# Patient Record
Sex: Female | Born: 1975 | Race: White | Hispanic: No | Marital: Married | Smoking: Never smoker
Health system: Southern US, Community
[De-identification: ages and names within clinical notes are randomized; demographics above are authoritative.]

## PROBLEM LIST (undated history)

## (undated) DIAGNOSIS — R002 Palpitations: Secondary | ICD-10-CM

## (undated) DIAGNOSIS — S83512A Sprain of anterior cruciate ligament of left knee, initial encounter: Secondary | ICD-10-CM

## (undated) HISTORY — PX: BUNIONECTOMY: SHX129

## (undated) HISTORY — PX: ANTERIOR CRUCIATE LIGAMENT REPAIR: SHX115

---

## 2006-08-02 ENCOUNTER — Ambulatory Visit: Payer: Self-pay | Admitting: Podiatry

## 2007-05-01 ENCOUNTER — Ambulatory Visit: Payer: Self-pay | Admitting: Obstetrics and Gynecology

## 2007-05-02 ENCOUNTER — Inpatient Hospital Stay: Payer: Self-pay | Admitting: Obstetrics and Gynecology

## 2011-04-29 ENCOUNTER — Ambulatory Visit: Payer: Self-pay

## 2012-06-04 ENCOUNTER — Ambulatory Visit: Payer: Self-pay | Admitting: Obstetrics and Gynecology

## 2013-06-10 ENCOUNTER — Ambulatory Visit: Payer: Self-pay | Admitting: Obstetrics and Gynecology

## 2014-05-22 ENCOUNTER — Ambulatory Visit: Payer: Self-pay | Admitting: Family Medicine

## 2014-05-22 LAB — URINALYSIS, COMPLETE
BILIRUBIN, UR: NEGATIVE
GLUCOSE, UR: NEGATIVE
Ketone: NEGATIVE
Nitrite: NEGATIVE
PH: 7.5 (ref 5.0–8.0)
Protein: NEGATIVE
Specific Gravity: 1.02 (ref 1.000–1.030)

## 2014-05-23 ENCOUNTER — Emergency Department: Payer: Self-pay | Admitting: Emergency Medicine

## 2014-05-23 LAB — URINALYSIS, COMPLETE
Specific Gravity: 1.01 (ref 1.003–1.030)
Transitional Epi: 1
WBC UR: 1135 /HPF (ref 0–5)

## 2014-05-24 LAB — URINE CULTURE

## 2014-05-25 LAB — URINE CULTURE

## 2014-09-03 ENCOUNTER — Ambulatory Visit: Payer: Self-pay | Admitting: Obstetrics and Gynecology

## 2014-09-08 ENCOUNTER — Ambulatory Visit: Payer: Self-pay | Admitting: Obstetrics and Gynecology

## 2014-11-09 ENCOUNTER — Ambulatory Visit: Payer: Self-pay | Admitting: Internal Medicine

## 2015-08-08 ENCOUNTER — Other Ambulatory Visit: Payer: Self-pay | Admitting: Obstetrics and Gynecology

## 2015-08-08 DIAGNOSIS — Z1231 Encounter for screening mammogram for malignant neoplasm of breast: Secondary | ICD-10-CM

## 2015-09-06 ENCOUNTER — Ambulatory Visit
Admission: RE | Admit: 2015-09-06 | Discharge: 2015-09-06 | Disposition: A | Discharge status: Home / Self Care | Payer: 59 | Source: Ambulatory Visit | Attending: Obstetrics and Gynecology | Admitting: Obstetrics and Gynecology

## 2015-09-06 DIAGNOSIS — Z1231 Encounter for screening mammogram for malignant neoplasm of breast: Secondary | ICD-10-CM | POA: Insufficient documentation

## 2016-06-06 DIAGNOSIS — M25562 Pain in left knee: Secondary | ICD-10-CM | POA: Insufficient documentation

## 2016-06-06 DIAGNOSIS — S83512A Sprain of anterior cruciate ligament of left knee, initial encounter: Secondary | ICD-10-CM | POA: Insufficient documentation

## 2016-06-07 ENCOUNTER — Other Ambulatory Visit: Payer: Self-pay | Admitting: Unknown Physician Specialty

## 2016-06-07 DIAGNOSIS — S83512A Sprain of anterior cruciate ligament of left knee, initial encounter: Secondary | ICD-10-CM

## 2016-06-19 ENCOUNTER — Ambulatory Visit
Admission: RE | Admit: 2016-06-19 | Discharge: 2016-06-19 | Disposition: A | Discharge status: Home / Self Care | Payer: 59 | Source: Ambulatory Visit | Attending: Unknown Physician Specialty | Admitting: Unknown Physician Specialty

## 2016-06-19 DIAGNOSIS — X58XXXA Exposure to other specified factors, initial encounter: Secondary | ICD-10-CM | POA: Diagnosis not present

## 2016-06-19 DIAGNOSIS — S83512A Sprain of anterior cruciate ligament of left knee, initial encounter: Secondary | ICD-10-CM

## 2016-06-19 DIAGNOSIS — M25462 Effusion, left knee: Secondary | ICD-10-CM | POA: Diagnosis not present

## 2016-06-21 DIAGNOSIS — R002 Palpitations: Secondary | ICD-10-CM | POA: Insufficient documentation

## 2016-09-05 ENCOUNTER — Other Ambulatory Visit: Payer: Self-pay | Admitting: Family Medicine

## 2016-09-05 ENCOUNTER — Other Ambulatory Visit: Payer: Self-pay | Admitting: Obstetrics and Gynecology

## 2016-09-05 DIAGNOSIS — Z1231 Encounter for screening mammogram for malignant neoplasm of breast: Secondary | ICD-10-CM

## 2016-09-16 ENCOUNTER — Ambulatory Visit
Admission: EM | Admit: 2016-09-16 | Discharge: 2016-09-16 | Disposition: A | Discharge status: Home / Self Care | Payer: 59 | Attending: Family Medicine | Admitting: Family Medicine

## 2016-09-16 ENCOUNTER — Encounter: Payer: Self-pay | Admitting: Gynecology

## 2016-09-16 DIAGNOSIS — J029 Acute pharyngitis, unspecified: Secondary | ICD-10-CM | POA: Diagnosis not present

## 2016-09-16 DIAGNOSIS — N3001 Acute cystitis with hematuria: Secondary | ICD-10-CM | POA: Diagnosis not present

## 2016-09-16 DIAGNOSIS — J069 Acute upper respiratory infection, unspecified: Secondary | ICD-10-CM

## 2016-09-16 DIAGNOSIS — N390 Urinary tract infection, site not specified: Secondary | ICD-10-CM | POA: Diagnosis not present

## 2016-09-16 HISTORY — DX: Sprain of anterior cruciate ligament of left knee, initial encounter: S83.512A

## 2016-09-16 HISTORY — DX: Palpitations: R00.2

## 2016-09-16 LAB — URINALYSIS, COMPLETE (UACMP) WITH MICROSCOPIC
BILIRUBIN URINE: NEGATIVE
Glucose, UA: NEGATIVE mg/dL
Ketones, ur: NEGATIVE mg/dL
Nitrite: NEGATIVE
PROTEIN: 30 mg/dL — AB
SPECIFIC GRAVITY, URINE: 1.02 (ref 1.005–1.030)
pH: 7 (ref 5.0–8.0)

## 2016-09-16 LAB — RAPID STREP SCREEN (MED CTR MEBANE ONLY): STREPTOCOCCUS, GROUP A SCREEN (DIRECT): NEGATIVE

## 2016-09-16 MED ORDER — CEPHALEXIN 500 MG PO CAPS: 500.0000 mg | ORAL_CAPSULE | Freq: Two times a day (BID) | ORAL | 0 refills | Status: DC

## 2016-09-16 MED ORDER — PHENAZOPYRIDINE HCL 200 MG PO TABS: 200.0000 mg | ORAL_TABLET | Freq: Three times a day (TID) | ORAL | 0 refills | Status: DC | PRN

## 2016-09-16 NOTE — Discharge Instructions (Signed)
[place] suggest follow-up with PCP in 2-3 weeks for repeat urinalysis to make sure that the blood in her urine has cleared

## 2016-09-16 NOTE — ED Triage Notes (Signed)
Per patient sore throat x 3 days. Patient stated frequency urination.

## 2016-09-16 NOTE — ED Provider Notes (Signed)
[facility]    CSN: [id number] Arrival date & time: 09/16/16  0820     History   Chief Complaint Chief Complaint  Patient presents with  . Sore Throat  . Urinary Tract Infection    HPI Brittany Rivers is a 40 y.o. female.   Patient's here because of nasal congestion sore throat and cough for about the last 4 days started on Wednesday.. She reports yesterday she started feeling symptoms and she would like to have a UTI. She states she's had only one tablet for nausea about 2 years ago she was seen here placed on antibiotics didn't clear things up and she wanted in the emergency room after 3 days later having to have antibiotic change. She reports as far as the sore throat not been running strep recently. For her symptoms of a UTI she denies actually burning or discomfort is just a matter feeling much her bladder is not emptying completely. She states last time she had a UTI was difficult starting that cause her to be seen and treated. She denies any dyspareunia any type of vaginal discharge. She states that birth control as her husband vasectomy she denies any allergies she does not smoke. No pertinent family medical history relevant to today's visit.   The history is provided by the patient. No language interpreter was used.  Sore Throat  This is a new problem. The current episode started more than 2 days ago. The problem occurs constantly. The problem has not changed since onset.Pertinent negatives include no chest pain, no abdominal pain, no headaches and no shortness of breath. Nothing aggravates the symptoms. Nothing relieves the symptoms. She has tried nothing for the symptoms. The treatment provided no relief.  Urinary Tract Infection  Pain quality:  Unable to specify Pain severity:  No pain Onset quality:  Sudden Progression:  Worsening Chronicity:  New Relieved by:  Nothing Worsened by:  Nothing Ineffective treatments:  None tried Associated symptoms: no  abdominal pain, no fever, no flank pain, no genital lesions, no nausea and no vaginal discharge   Risk factors: sexually active   Risk factors: no hx of urolithiasis, no kidney transplant, not pregnant, no recurrent urinary tract infections and no sexually transmitted infections     Past Medical History:  Diagnosis Date  . Palpitations   . Rupture of anterior cruciate ligament of left knee     There are no active problems to display for this patient.   Past Surgical History:  Procedure Laterality Date  . ANTERIOR CRUCIATE LIGAMENT REPAIR    . BUNIONECTOMY    . CESAREAN SECTION      OB History    No data available       Home Medications    Prior to Admission medications   Medication Sig Start Date End Date Taking? Authorizing Provider  cephALEXin (KEFLEX) 500 MG capsule Take 1 capsule (500 mg total) by mouth 2 (two) times daily. 09/16/16   Frederich Cha, MD  phenazopyridine (PYRIDIUM) 200 MG tablet Take 1 tablet (200 mg total) by mouth 3 (three) times daily as needed for pain. 09/16/16   Frederich Cha, MD    Family History Family History  Problem Relation Age of Onset  . Breast cancer Neg Hx     Social History Social History  Substance Use Topics  . Smoking status: Never Smoker  . Smokeless tobacco: Never Used  . Alcohol use No     Allergies   Penicillins   Review of Systems  Review of Systems  Constitutional: Negative for fever.  Respiratory: Negative for shortness of breath.   Cardiovascular: Negative for chest pain.  Gastrointestinal: Negative for abdominal pain and nausea.  Genitourinary: Negative for flank pain and vaginal discharge.  Neurological: Negative for headaches.  All other systems reviewed and are negative.    Physical Exam Triage Vital Signs ED Triage Vitals  Enc Vitals Group     BP 09/16/16 0841 113/67     Pulse Rate 09/16/16 0841 80     Resp 09/16/16 0841 16     Temp 09/16/16 0841 98.7 F (37.1 C)     Temp Source 09/16/16 0841  Oral     SpO2 09/16/16 0841 100 %     Weight 09/16/16 0843 164 lb (74.4 kg)     Height 09/16/16 0843 5' 7"  (1.702 m)     Head Circumference --      Peak Flow --      Pain Score 09/16/16 0844 3     Pain Loc --      Pain Edu? --      Excl. in [place]? --    No data found.   Updated Vital Signs BP 113/67 (BP Location: Left Arm)   Pulse 80   Temp 98.7 F (37.1 C) (Oral)   Resp 16   Ht 5' 7"  (1.702 m)   Wt 164 lb (74.4 kg)   LMP 09/07/2016   SpO2 100%   BMI 25.69 kg/m   Visual Acuity Right Eye Distance:   Left Eye Distance:   Bilateral Distance:    Right Eye Near:   Left Eye Near:    Bilateral Near:     Physical Exam  Constitutional: She is oriented to person, place, and time.  HENT:  Head: Normocephalic and atraumatic.  Right Ear: Hearing, tympanic membrane, external ear and ear canal normal.  Left Ear: Hearing, tympanic membrane and ear canal normal.  Nose: Mucosal edema present. No rhinorrhea.  Mouth/Throat: Uvula is midline and mucous membranes are normal. Posterior oropharyngeal erythema present.  Eyes: EOM are normal. Pupils are equal, round, and reactive to light.  Neck: Normal range of motion. Neck supple.  Cardiovascular: Normal rate and regular rhythm.   Pulmonary/Chest: Effort normal and breath sounds normal.  Abdominal: Soft. She exhibits no distension. There is no tenderness.  Neurological: She is alert and oriented to person, place, and time.  Skin: Skin is warm and dry.  Psychiatric: She has a normal mood and affect.  Vitals reviewed.    UC Treatments / Results  Labs (all labs ordered are listed, but only abnormal results are displayed) Labs Reviewed  URINALYSIS, COMPLETE (UACMP) WITH MICROSCOPIC - Abnormal; Notable for the following:       Result Value   APPearance HAZY (*)    Hgb urine dipstick MODERATE (*)    Protein, ur 30 (*)    Leukocytes, UA LARGE (*)    Squamous Epithelial / LPF 0-5 (*)    Non Squamous Epithelial PRESENT (*)     Bacteria, UA FEW (*)    All other components within normal limits  RAPID STREP SCREEN (NOT AT [facility])  CULTURE, GROUP A STREP [facility])  URINE CULTURE    EKG  EKG Interpretation None       Radiology No results found.  Procedures Procedures (including critical care time)  Medications Ordered in UC Medications - No data to display   Initial Impression / Assessment and Plan / UC Course  I have reviewed  the triage vital signs and the nursing notes.  Pertinent labs & imaging results that were available during my care of the patient were reviewed by me and considered in my medical decision making (see chart for details).  Clinical Course     Patient will be treated for UTI with Keflex 500 mg 1 tablet twice a day should give coverage forced atypical strep may need to have repeat strep test of strep culture is positive. Because of mild blood specimen urine recommend follow-up PCP in 2-3 weeks for recheck or hematuria. Return follow-up PCP in a week if not doing better. Also explained to her that concerned with that amount of bacteria present with this could've been a vaginitis but since she denies any vaginal symptoms will treat for UTI and urine only had 0-5 epithelial cells present culture the urine was also obtained  Final Clinical Impressions(s) / UC Diagnoses   Final diagnoses:  Lower urinary tract infectious disease  Acute cystitis with hematuria  Pharyngitis, unspecified etiology  Upper respiratory tract infection, unspecified type    New Prescriptions Discharge Medication List as of 09/16/2016 10:06 AM    START taking these medications   Details  cephALEXin (KEFLEX) 500 MG capsule Take 1 capsule (500 mg total) by mouth 2 (two) times daily., Starting Sun 09/16/2016, Normal    phenazopyridine (PYRIDIUM) 200 MG tablet Take 1 tablet (200 mg total) by mouth 3 (three) times daily as needed for pain., Starting Sun 09/16/2016, Normal        Note: This dictation was prepared  with Dragon dictation along with smaller phrase technology. Any transcriptional errors that result from this process are unintentional.   Frederich Cha, MD 09/16/16 1118

## 2016-09-18 LAB — URINE CULTURE: SPECIAL REQUESTS: NORMAL

## 2016-09-19 LAB — CULTURE, GROUP A STREP (THRC)

## 2016-10-04 ENCOUNTER — Ambulatory Visit: Payer: 59

## 2016-10-31 ENCOUNTER — Ambulatory Visit
Admission: RE | Admit: 2016-10-31 | Discharge: 2016-10-31 | Disposition: A | Discharge status: Home / Self Care | Payer: 59 | Source: Ambulatory Visit | Attending: Family Medicine | Admitting: Family Medicine

## 2016-10-31 DIAGNOSIS — Z1231 Encounter for screening mammogram for malignant neoplasm of breast: Secondary | ICD-10-CM | POA: Insufficient documentation

## 2017-02-19 ENCOUNTER — Other Ambulatory Visit: Payer: Self-pay | Admitting: Obstetrics and Gynecology

## 2017-02-19 DIAGNOSIS — Z1231 Encounter for screening mammogram for malignant neoplasm of breast: Secondary | ICD-10-CM

## 2017-04-18 ENCOUNTER — Ambulatory Visit
Admission: EM | Admit: 2017-04-18 | Discharge: 2017-04-18 | Disposition: A | Discharge status: Home / Self Care | Payer: 59 | Attending: Emergency Medicine | Admitting: Emergency Medicine

## 2017-04-18 ENCOUNTER — Encounter: Payer: Self-pay | Admitting: Emergency Medicine

## 2017-04-18 DIAGNOSIS — R319 Hematuria, unspecified: Secondary | ICD-10-CM | POA: Diagnosis not present

## 2017-04-18 DIAGNOSIS — R350 Frequency of micturition: Secondary | ICD-10-CM

## 2017-04-18 DIAGNOSIS — N390 Urinary tract infection, site not specified: Secondary | ICD-10-CM | POA: Diagnosis not present

## 2017-04-18 LAB — URINALYSIS, COMPLETE (UACMP) WITH MICROSCOPIC
BILIRUBIN URINE: NEGATIVE
GLUCOSE, UA: NEGATIVE mg/dL
Hgb urine dipstick: NEGATIVE
Ketones, ur: NEGATIVE mg/dL
NITRITE: NEGATIVE
PH: 7 (ref 5.0–8.0)
Protein, ur: NEGATIVE mg/dL
RBC / HPF: NONE SEEN RBC/hpf (ref 0–5)
SPECIFIC GRAVITY, URINE: 1.01 (ref 1.005–1.030)

## 2017-04-18 MED ORDER — NITROFURANTOIN MONOHYD MACRO 100 MG PO CAPS: 100.0000 mg | ORAL_CAPSULE | Freq: Two times a day (BID) | ORAL | 0 refills | Status: DC

## 2017-04-18 MED ORDER — PHENAZOPYRIDINE HCL 200 MG PO TABS: 200.0000 mg | ORAL_TABLET | Freq: Three times a day (TID) | ORAL | 0 refills | Status: DC | PRN

## 2017-04-18 NOTE — ED Provider Notes (Signed)
HPI  SUBJECTIVE:  Brittany Rivers is a 41 y.o. female who presents with urinary urgency, frequency starting today. She tried pushing fluids without improvement in her symptoms. There are no aggravating factors. She denies dysuria, cloudy or odorous urine, hematuria. No nausea, vomiting, fevers, abdominal, back or pelvic pain. No vaginal bleeding, itch, odor, discharge, genital rash. She is in a long-term monogamous relationship with her husband who is asymptomatic. STDs are not a concern today. No recent antibiotics. No perfumed body washes/soaps. She has a past medical history of UTIs which were treated with Keflex. No history of pyelonephritis, nephrolithiasis, diabetes, hypertension. No history of gonorrhea, chlamydia, HIV, HSV, syphilis, Trichomonas, BV, yeast. LMP: 2 weeks ago. Denies possibility pregnant and states that we do not need to check. [id number], Titus Mould, DO   Past Medical History:  Diagnosis Date  . Palpitations   . Rupture of anterior cruciate ligament of left knee     Past Surgical History:  Procedure Laterality Date  . ANTERIOR CRUCIATE LIGAMENT REPAIR    . BUNIONECTOMY    . CESAREAN SECTION      Family History  Problem Relation Age of Onset  . Breast cancer Neg Hx     Social History  Substance Use Topics  . Smoking status: Never Smoker  . Smokeless tobacco: Never Used  . Alcohol use No    No current facility-administered medications for this encounter.   Current Outpatient Prescriptions:  .  nitrofurantoin, macrocrystal-monohydrate, (MACROBID) 100 MG capsule, Take 1 capsule (100 mg total) by mouth 2 (two) times daily. X 5 days, Disp: 10 capsule, Rfl: 0 .  phenazopyridine (PYRIDIUM) 200 MG tablet, Take 1 tablet (200 mg total) by mouth 3 (three) times daily as needed for pain., Disp: 6 tablet, Rfl: 0  Allergies  Allergen Reactions  . Penicillins Rash     ROS  As noted in HPI.   Physical Exam  BP 124/85 (BP Location: Left Arm)   Pulse 63    Resp 16   Ht 5' 7"  (1.702 m)   Wt 165 lb (74.8 kg)   LMP 04/04/2017 (Approximate)   SpO2 100%   BMI 25.84 kg/m   Constitutional: Well developed, well nourished, no acute distress Eyes:  EOMI, conjunctiva normal bilaterally HENT: Normocephalic, atraumatic,mucus membranes moist Respiratory: Normal inspiratory effort Cardiovascular: Normal rate GI: nondistended. Soft. No suprapubic or flank tenderness Back: No CVA tenderness GU: Deferred skin: No rash, skin intact Musculoskeletal: no deformities Neurologic: Alert & oriented x 3, no focal neuro deficits Psychiatric: Speech and behavior appropriate   ED Course   Medications - No data to display  Orders Placed This Encounter  Procedures  . Urine culture    Standing Status:   Standing    Number of Occurrences:   1  . Urinalysis, Complete w Microscopic    Standing Status:   Standing    Number of Occurrences:   1    Results for orders placed or performed during the hospital encounter of 04/18/17 (from the past 24 hour(s))  Urinalysis, Complete w Microscopic     Status: Abnormal   Collection Time: 04/18/17  7:46 PM  Result Value Ref Range   Color, Urine YELLOW YELLOW   APPearance CLEAR CLEAR   Specific Gravity, Urine 1.010 1.005 - 1.030   pH 7.0 5.0 - 8.0   Glucose, UA NEGATIVE NEGATIVE mg/dL   Hgb urine dipstick NEGATIVE NEGATIVE   Bilirubin Urine NEGATIVE NEGATIVE   Ketones, ur NEGATIVE NEGATIVE mg/dL   Protein,  ur NEGATIVE NEGATIVE mg/dL   Nitrite NEGATIVE NEGATIVE   Leukocytes, UA SMALL (A) NEGATIVE   Squamous Epithelial / LPF 0-5 (A) NONE SEEN   WBC, UA 6-30 0 - 5 WBC/hpf   RBC / HPF NONE SEEN 0 - 5 RBC/hpf   Bacteria, UA FEW (A) NONE SEEN   No results found.  ED Clinical Impression  Urinary tract infection without hematuria, site unspecified   ED Assessment/Plan  Previous labs reviewed. Patient had an Escherichia coli UTI that was sensitive to [facility] in January 2018.  UA does not clearly  demonstrate a UTI as it is a slightly contaminated specimen but she does have a few bacteria and small leukocytes however given patient's clinical presentation we'll treat empirically with Macrobid and Pyridium. Discussed with her the other causes of her symptoms such as gynecologic infections, and discussed with her the option of doing a pelvic today collecting samples however she has decided to treat empirically for a UTI. she will return here or see her doctor if she is not getting better with the medication. Sending urine off for culture to confirm antibiotic choice.  Discussed labs, imaging, MDM, plan and followup with patient  Discussed sn/sx that should prompt return to the ED. Patient agrees with plan.   Meds ordered this encounter  Medications  . phenazopyridine (PYRIDIUM) 200 MG tablet    Sig: Take 1 tablet (200 mg total) by mouth 3 (three) times daily as needed for pain.    Dispense:  6 tablet    Refill:  0  . nitrofurantoin, macrocrystal-monohydrate, (MACROBID) 100 MG capsule    Sig: Take 1 capsule (100 mg total) by mouth 2 (two) times daily. X 5 days    Dispense:  10 capsule    Refill:  0    *This clinic note was created using [employer]. Therefore, there may be occasional mistakes despite careful proofreading.  ?   Melynda Ripple, MD 04/18/17 2032

## 2017-04-18 NOTE — ED Triage Notes (Signed)
Patient c/o burning when urinating and increase in urinary urgency that started today.

## 2017-04-18 NOTE — Discharge Instructions (Signed)
continue drinking plenty of extra fluids. Finish the medication unless your doctor tells you to stop.

## 2017-04-20 LAB — URINE CULTURE

## 2017-11-06 ENCOUNTER — Ambulatory Visit
Admission: RE | Admit: 2017-11-06 | Discharge: 2017-11-06 | Disposition: A | Discharge status: Home / Self Care | Payer: Managed Care, Other (non HMO) | Source: Ambulatory Visit | Attending: Obstetrics and Gynecology | Admitting: Obstetrics and Gynecology

## 2017-11-06 DIAGNOSIS — Z1231 Encounter for screening mammogram for malignant neoplasm of breast: Secondary | ICD-10-CM | POA: Insufficient documentation

## 2017-11-06 DIAGNOSIS — R928 Other abnormal and inconclusive findings on diagnostic imaging of breast: Secondary | ICD-10-CM | POA: Insufficient documentation

## 2017-11-11 ENCOUNTER — Other Ambulatory Visit: Payer: Self-pay | Admitting: Obstetrics and Gynecology

## 2017-11-11 DIAGNOSIS — R928 Other abnormal and inconclusive findings on diagnostic imaging of breast: Secondary | ICD-10-CM

## 2017-11-11 DIAGNOSIS — N6489 Other specified disorders of breast: Secondary | ICD-10-CM

## 2017-11-14 ENCOUNTER — Ambulatory Visit
Admission: RE | Admit: 2017-11-14 | Discharge: 2017-11-14 | Disposition: A | Discharge status: Home / Self Care | Payer: Managed Care, Other (non HMO) | Source: Ambulatory Visit | Attending: Obstetrics and Gynecology | Admitting: Obstetrics and Gynecology

## 2017-11-14 DIAGNOSIS — N6489 Other specified disorders of breast: Secondary | ICD-10-CM | POA: Insufficient documentation

## 2017-11-14 DIAGNOSIS — R928 Other abnormal and inconclusive findings on diagnostic imaging of breast: Secondary | ICD-10-CM | POA: Diagnosis present

## 2018-08-01 ENCOUNTER — Other Ambulatory Visit: Payer: Self-pay | Admitting: Physician Assistant

## 2018-08-01 DIAGNOSIS — M5412 Radiculopathy, cervical region: Secondary | ICD-10-CM

## 2018-08-21 ENCOUNTER — Ambulatory Visit
Admission: RE | Admit: 2018-08-21 | Discharge: 2018-08-21 | Disposition: A | Discharge status: Home / Self Care | Payer: Managed Care, Other (non HMO) | Source: Ambulatory Visit | Attending: Physician Assistant | Admitting: Physician Assistant

## 2018-08-21 DIAGNOSIS — M50223 Other cervical disc displacement at C6-C7 level: Secondary | ICD-10-CM | POA: Diagnosis not present

## 2018-08-21 DIAGNOSIS — M5412 Radiculopathy, cervical region: Secondary | ICD-10-CM | POA: Insufficient documentation

## 2018-10-24 ENCOUNTER — Other Ambulatory Visit: Payer: Self-pay | Admitting: Physician Assistant

## 2018-10-24 DIAGNOSIS — Z1231 Encounter for screening mammogram for malignant neoplasm of breast: Secondary | ICD-10-CM

## 2018-11-11 ENCOUNTER — Ambulatory Visit
Admission: RE | Admit: 2018-11-11 | Discharge: 2018-11-11 | Disposition: A | Discharge status: Home / Self Care | Payer: Managed Care, Other (non HMO) | Source: Ambulatory Visit | Attending: Physician Assistant | Admitting: Physician Assistant

## 2018-11-11 DIAGNOSIS — Z1231 Encounter for screening mammogram for malignant neoplasm of breast: Secondary | ICD-10-CM | POA: Diagnosis present

## 2019-10-07 ENCOUNTER — Other Ambulatory Visit: Payer: Self-pay | Admitting: Physician Assistant

## 2019-10-07 DIAGNOSIS — K219 Gastro-esophageal reflux disease without esophagitis: Secondary | ICD-10-CM

## 2019-10-07 DIAGNOSIS — R1906 Epigastric swelling, mass or lump: Secondary | ICD-10-CM

## 2019-10-07 DIAGNOSIS — K30 Functional dyspepsia: Secondary | ICD-10-CM

## 2019-10-13 ENCOUNTER — Other Ambulatory Visit: Payer: Self-pay

## 2019-10-13 ENCOUNTER — Ambulatory Visit
Admission: RE | Admit: 2019-10-13 | Discharge: 2019-10-13 | Disposition: A | Discharge status: Home / Self Care | Payer: Managed Care, Other (non HMO) | Source: Ambulatory Visit | Attending: Physician Assistant | Admitting: Physician Assistant

## 2019-10-13 DIAGNOSIS — K219 Gastro-esophageal reflux disease without esophagitis: Secondary | ICD-10-CM | POA: Insufficient documentation

## 2019-10-13 DIAGNOSIS — R1906 Epigastric swelling, mass or lump: Secondary | ICD-10-CM | POA: Diagnosis present

## 2019-10-13 DIAGNOSIS — K30 Functional dyspepsia: Secondary | ICD-10-CM | POA: Diagnosis present

## 2019-10-14 ENCOUNTER — Other Ambulatory Visit: Payer: Self-pay | Admitting: Physician Assistant

## 2019-10-14 DIAGNOSIS — Z1231 Encounter for screening mammogram for malignant neoplasm of breast: Secondary | ICD-10-CM

## 2019-11-16 ENCOUNTER — Ambulatory Visit
Admission: RE | Admit: 2019-11-16 | Discharge: 2019-11-16 | Disposition: A | Discharge status: Home / Self Care | Payer: Managed Care, Other (non HMO) | Source: Ambulatory Visit | Attending: Physician Assistant | Admitting: Physician Assistant

## 2019-11-16 ENCOUNTER — Other Ambulatory Visit: Payer: Self-pay

## 2019-11-16 DIAGNOSIS — Z1231 Encounter for screening mammogram for malignant neoplasm of breast: Secondary | ICD-10-CM

## 2020-04-27 ENCOUNTER — Ambulatory Visit (INDEPENDENT_AMBULATORY_CARE_PROVIDER_SITE_OTHER): Payer: Managed Care, Other (non HMO) | Admitting: Dermatology

## 2020-04-27 ENCOUNTER — Other Ambulatory Visit: Payer: Self-pay

## 2020-04-27 DIAGNOSIS — Z1283 Encounter for screening for malignant neoplasm of skin: Secondary | ICD-10-CM

## 2020-04-27 DIAGNOSIS — D229 Melanocytic nevi, unspecified: Secondary | ICD-10-CM

## 2020-04-27 DIAGNOSIS — L719 Rosacea, unspecified: Secondary | ICD-10-CM

## 2020-04-27 DIAGNOSIS — D485 Neoplasm of uncertain behavior of skin: Secondary | ICD-10-CM

## 2020-04-27 DIAGNOSIS — L815 Leukoderma, not elsewhere classified: Secondary | ICD-10-CM

## 2020-04-27 DIAGNOSIS — L578 Other skin changes due to chronic exposure to nonionizing radiation: Secondary | ICD-10-CM

## 2020-04-27 DIAGNOSIS — L814 Other melanin hyperpigmentation: Secondary | ICD-10-CM

## 2020-04-27 DIAGNOSIS — L821 Other seborrheic keratosis: Secondary | ICD-10-CM

## 2020-04-27 DIAGNOSIS — D1800 Hemangioma unspecified site: Secondary | ICD-10-CM

## 2020-04-27 NOTE — Patient Instructions (Addendum)
Melanoma ABCDEs  Melanoma is the most dangerous type of skin cancer, and is the leading cause of death from skin disease.  You are more likely to develop melanoma if you:  Have light-colored skin, light-colored eyes, or red or blond hair  Spend a lot of time in the sun  Tan regularly, either outdoors or in a tanning bed  Have had blistering sunburns, especially during childhood  Have a close family member who has had a melanoma  Have atypical moles or large birthmarks  Early detection of melanoma is key since treatment is typically straightforward and cure rates are extremely high if we catch it early.   The first sign of melanoma is often a change in a mole or a new dark spot.  The ABCDE system is a way of remembering the signs of melanoma.  A for asymmetry:  The two halves do not match. B for border:  The edges of the growth are irregular. C for color:  A mixture of colors are present instead of an even brown color. D for diameter:  Melanomas are usually (but not always) greater than 17m - the size of a pencil eraser. E for evolution:  The spot keeps changing in size, shape, and color.  Please check your skin once per month between visits. You can use a small mirror in front and a large mirror behind you to keep an eye on the back side or your body.   If you see any new or changing lesions before your next follow-up, please call to schedule a visit.  Please continue daily skin protection including broad spectrum sunscreen SPF 30+ to sun-exposed areas, reapplying every 2 hours as needed when you're outdoors.   Rosacea  What is rosacea? Rosacea (say: ro-zay-sha) is a common skin disease that usually begins as a trend of flushing or blushing easily.  As rosacea progresses, a persistent redness in the center of the face will develop and may gradually spread beyond the nose and cheeks to the forehead and chin.  In some cases, the ears, chest, and back could be affected.  Rosacea may  appear as tiny blood vessels or small red bumps that occur in crops.  Frequently they can contain pus, and are called pustules.  If the bumps do not contain pus, they are referred to as papules.  Rarely, in prolonged, untreated cases of rosacea, the oil glands of the nose and cheeks may become permanently enlarged.  This is called rhinophyma, and is seen more frequently in men.  Signs and Risks In its beginning stages, rosacea tends to come and go, which makes it difficult to recognize.  It can start as intermittent flushing of the face.  Eventually, blood vessels may become permanently visible.  Pustules and papules can appear, but can be mistaken for adult acne.  People of all races, ages, genders and ethnic groups are at risk of developing rosacea.  However, it is more common in women (especially around menopause) and adults with fair skin between the ages of 352and 539  Treatment Dermatologists typically recommend a combination of treatments to effectively manage rosacea.  Treatment can improve symptoms and may stop the progression of the rosacea.  Treatment may involve both topical and oral medications.  The tetracycline antibiotics are often used for their anti-inflammatory effect; however, because of the possibility of developing antibiotic resistance, they should not be used long term at full dose.  For dilated blood vessels the options include electrodessication (uses electric current  through a small needle), laser treatment, and cosmetics to hide the redness.   With all forms of treatment, improvement is a slow process, and patients may not see any results for the first 3-4 weeks.  It is very important to avoid the sun and other triggers.  Patients must wear sunscreen daily.  Skin Care Instructions: 1. Cleanse the skin with a mild soap such as CeraVe cleanser, Cetaphil cleanser, or Dove soap once or twice daily as needed. 2. Moisturize with Eucerin Redness Relief Daily Perfecting Lotion (has  a subtle green tint), CeraVe Moisturizing Cream, or Oil of Olay Daily Moisturizer with sunscreen every morning and/or night as recommended. 3. Makeup should be non-comedogenic (wont clog pores) and be labeled for sensitive skin. Good choices for cosmetics are: Neutrogena, Almay, and Physicians Formula.  Any product with a green tint tends to offset a red complexion. 4. If your eyes are dry and irritated, use artificial tears 2-3 times per day and cleanse the eyelids daily with baby shampoo.  Have your eyes examined at least every 2 years.  Be sure to tell your eye doctor that you have rosacea. 5. Alcoholic beverages tend to cause flushing of the skin, and may make rosacea worse. 6. Always wear sunscreen, protect your skin from extreme hot and cold temperatures, and avoid spicy foods, hot drinks, and mechanical irritation such as rubbing, scrubbing, or massaging the face.  Avoid harsh skin cleansers, cleansing masks, astringents, and exfoliation. If a particular product burns or makes your face feel tight, then it is likely to flare your rosacea. 7. If you are having difficulty finding a sunscreen that you can tolerate, you may try switching to a chemical-free sunscreen.  These are ones whose active ingredient is zinc oxide or titanium dioxide only.  They should also be fragrance free, non-comedogenic, and labeled for sensitive skin. 8. Rosacea triggers may vary from person to person.  There are a variety of foods that have been reported to trigger rosacea.  Some patients find that keeping a diary of what they were doing when they flared helps them avoid triggers.

## 2020-04-27 NOTE — Progress Notes (Signed)
   New Patient Visit  Subjective  Brittany Rivers is a 44 y.o. female who presents for the following: Annual Exam full body skin exam and skin cancer screening  Patient here today for TBSE. She had 2 spots on the skin removed about 15 years ago and was told results showed some abnormal cells. They had to go back and remove more. There is nothing new or changing that patient is aware of today.  The following portions of the chart were reviewed this encounter and updated as appropriate:  Tobacco  Allergies  Meds  Problems  Med Hx  Surg Hx  Fam Hx      Review of Systems:  No other skin or systemic complaints except as noted in HPI or Assessment and Plan.  Objective  Well appearing patient in no apparent distress; mood and affect are within normal limits.  A full examination was performed including scalp, head, eyes, ears, nose, lips, neck, chest, axillae, abdomen, back, buttocks, bilateral upper extremities, bilateral lower extremities, hands, feet, fingers, toes, fingernails, and toenails. All findings within normal limits unless otherwise noted below.  Objective  face: Erythema at mid face  Objective  Legs: White spots  Objective  Right buttock: Hypopigmented soft plaque   Assessment & Plan  Rosacea face  Patient advises no grittiness or irritation of her eyes Discussed option of topical medication (metronidazole cream, ivermectin cream, rhofade). Treatment deferred   Guttate hypomelanosis Legs  Sun-induced. Benign, observe. Recommend daily broad spectrum sunscreen SPF 30+ to sun-exposed areas, reapply every 2 hours as needed. Call for new or changing lesions.    Neoplasm of uncertain behavior of skin Right buttock  Favor Nevus Lipomatosis Superficialis  Benign-appearing.  Observation.  Call clinic for new or changing lesions.  Recommend daily use of broad spectrum spf 30+ sunscreen to sun-exposed areas.     Lentigines - Scattered tan macules - Discussed  due to sun exposure - Benign, observe - Call for any changes  Seborrheic Keratoses - Stuck-on, waxy, tan-brown papules and plaques  - Discussed benign etiology and prognosis. - Observe - Call for any changes  Melanocytic Nevi - Tan-brown and/or pink-flesh-colored symmetric macules and papules - Benign appearing on exam today - Observation - Call clinic for new or changing moles - Recommend daily use of broad spectrum spf 30+ sunscreen to sun-exposed areas.   Hemangiomas - Red papules - Discussed benign nature - Observe - Call for any changes  Actinic Damage - diffuse scaly erythematous macules with underlying dyspigmentation - Recommend daily broad spectrum sunscreen SPF 30+ to sun-exposed areas, reapply every 2 hours as needed.  - Call for new or changing lesions.  Skin cancer screening performed today.  Return in about 1 year (around 04/27/2021) for TBSE.  Graciella Belton, RMA, am acting as scribe for Forest Gleason, MD .  Documentation: I have reviewed the above documentation for accuracy and completeness, and I agree with the above.  Forest Gleason, MD

## 2020-05-08 ENCOUNTER — Encounter: Payer: Self-pay | Admitting: Dermatology

## 2020-08-22 DIAGNOSIS — R0602 Shortness of breath: Secondary | ICD-10-CM | POA: Insufficient documentation

## 2020-10-28 ENCOUNTER — Other Ambulatory Visit: Payer: Self-pay | Admitting: Physician Assistant

## 2020-10-28 DIAGNOSIS — Z1231 Encounter for screening mammogram for malignant neoplasm of breast: Secondary | ICD-10-CM

## 2020-11-14 ENCOUNTER — Encounter: Payer: Self-pay | Admitting: Emergency Medicine

## 2020-11-14 ENCOUNTER — Ambulatory Visit
Admission: EM | Admit: 2020-11-14 | Discharge: 2020-11-14 | Disposition: A | Discharge status: Home / Self Care | Payer: Managed Care, Other (non HMO) | Attending: Physician Assistant | Admitting: Physician Assistant

## 2020-11-14 ENCOUNTER — Other Ambulatory Visit: Payer: Self-pay

## 2020-11-14 DIAGNOSIS — H6001 Abscess of right external ear: Secondary | ICD-10-CM | POA: Diagnosis not present

## 2020-11-14 MED ORDER — DOXYCYCLINE HYCLATE 100 MG PO CAPS: 100.0000 mg | ORAL_CAPSULE | Freq: Two times a day (BID) | ORAL | 0 refills | Status: AC

## 2020-11-14 NOTE — Discharge Instructions (Signed)
You have an abscess or a skin infection.  Start the antibiotics as prescribed and take full course.  Apply moist heat to the area several times throughout the day and you can also take Tylenol and ibuprofen for pain relief as needed.  Keep an eye on this area and if any swelling, redness or pain worsen you may need to be seen again or call our office.

## 2020-11-14 NOTE — ED Triage Notes (Signed)
Pt c/o right ear pain, redness and swelling. Started about 3 days ago and has gotten worse.

## 2020-11-14 NOTE — ED Provider Notes (Signed)
[facility]    CSN: [id number] Arrival date & time: 11/14/20  6433      History   Chief Complaint Chief Complaint  Patient presents with  . Otalgia    right    HPI Brittany Rivers is a 45 y.o. female presenting for approximately 3-day history of swelling, redness and erythema of her right tragus.  Patient states that the swelling is little better today than yesterday.  She has been applying warm compresses and taking ibuprofen.  Patient denies any injury to the ear or abrasion/lacerations.  She denies drainage from the ear.  Patient does state that she "scratches inside the ear a lot."  She denies any fever, fatigue, headaches, dizziness, cough, congestion or sore throat.  Denies history of recurrent skin infections.  Patient has no other complaints or concerns today.  HPI  Past Medical History:  Diagnosis Date  . Palpitations   . Rupture of anterior cruciate ligament of left knee     There are no problems to display for this patient.   Past Surgical History:  Procedure Laterality Date  . ANTERIOR CRUCIATE LIGAMENT REPAIR    . BUNIONECTOMY    . CESAREAN SECTION      OB History   No obstetric history on file.      Home Medications    Prior to Admission medications   Medication Sig Start Date End Date Taking? Authorizing Provider  doxycycline (VIBRAMYCIN) 100 MG capsule Take 1 capsule (100 mg total) by mouth 2 (two) times daily for 7 days. 11/14/20 11/21/20 Yes Danton Clap, PA-C    Family History Family History  Problem Relation Age of Onset  . Breast cancer Neg Hx     Social History Social History   Tobacco Use  . Smoking status: Never Smoker  . Smokeless tobacco: Never Used  Vaping Use  . Vaping Use: Never used  Substance Use Topics  . Alcohol use: No     Allergies   Penicillins   Review of Systems Review of Systems  Constitutional: Negative for chills, diaphoresis, fatigue and fever.  HENT: Positive for ear pain. Negative for  congestion, rhinorrhea and sore throat.   Respiratory: Negative for cough.   Gastrointestinal: Negative for nausea and vomiting.  Skin: Negative for rash.  Neurological: Negative for weakness and headaches.  Hematological: Negative for adenopathy.     Physical Exam Triage Vital Signs ED Triage Vitals  Enc Vitals Group     BP 11/14/20 0845 126/73     Pulse Rate 11/14/20 0845 76     Resp 11/14/20 0845 18     Temp 11/14/20 0845 98.4 F (36.9 C)     Temp Source 11/14/20 0845 Oral     SpO2 11/14/20 0845 100 %     Weight 11/14/20 0844 164 lb 14.5 oz (74.8 kg)     Height 11/14/20 0844 5' 7"  (1.702 m)     Head Circumference --      Peak Flow --      Pain Score 11/14/20 0843 2     Pain Loc --      Pain Edu? --      Excl. in [place]? --    No data found.  Updated Vital Signs BP 126/73 (BP Location: Right Arm)   Pulse 76   Temp 98.4 F (36.9 C) (Oral)   Resp 18   Ht 5' 7"  (1.702 m)   Wt 164 lb 14.5 oz (74.8 kg)   LMP 10/17/2020  SpO2 100%   BMI 25.83 kg/m       Physical Exam Vitals and nursing note reviewed.  Constitutional:      General: She is not in acute distress.    Appearance: Normal appearance. She is not ill-appearing or toxic-appearing.  HENT:     Head: Normocephalic and atraumatic.     Right Ear: Tympanic membrane and ear canal normal.     Left Ear: Tympanic membrane, ear canal and external ear normal.     Ears:     Comments: Moderate induration, erythema, and tenderness of the right tragus.  No fluctuance.  No drainage.    Nose: Nose normal.     Mouth/Throat:     Mouth: Mucous membranes are moist.     Pharynx: Oropharynx is clear.  Eyes:     General: No scleral icterus.       Right eye: No discharge.        Left eye: No discharge.     Conjunctiva/sclera: Conjunctivae normal.  Cardiovascular:     Rate and Rhythm: Normal rate and regular rhythm.     Heart sounds: Normal heart sounds.  Pulmonary:     Effort: Pulmonary effort is normal. No respiratory  distress.     Breath sounds: Normal breath sounds.  Musculoskeletal:     Cervical back: Neck supple.  Skin:    General: Skin is dry.  Neurological:     General: No focal deficit present.     Mental Status: She is alert. Mental status is at baseline.     Motor: No weakness.     Gait: Gait normal.  Psychiatric:        Mood and Affect: Mood normal.        Behavior: Behavior normal.        Thought Content: Thought content normal.      UC Treatments / Results  Labs (all labs ordered are listed, but only abnormal results are displayed) Labs Reviewed - No data to display  EKG   Radiology No results found.  Procedures Procedures (including critical care time)  Medications Ordered in UC Medications - No data to display  Initial Impression / Assessment and Plan / UC Course  I have reviewed the triage vital signs and the nursing notes.  Pertinent labs & imaging results that were available during my care of the patient were reviewed by me and considered in my medical decision making (see chart for details).   Exam is consistent with abscess right tragus. Treating with doxycycline and supportive care at this time.  Is warm compresses.  Advised Tylenol or ibuprofen for pain relief as needed.  Explained that she should breathing better in the next couple of days.  ED precautions and return precautions reviewed with patient.   Final Clinical Impressions(s) / UC Diagnoses   Final diagnoses:  Abscess of tragus of right ear     Discharge Instructions     You have an abscess or a skin infection.  Start the antibiotics as prescribed and take full course.  Apply moist heat to the area several times throughout the day and you can also take Tylenol and ibuprofen for pain relief as needed.  Keep an eye on this area and if any swelling, redness or pain worsen you may need to be seen again or call our office.    ED Prescriptions    Medication Sig Dispense Auth. Provider   doxycycline  (VIBRAMYCIN) 100 MG capsule Take 1 capsule (100 mg total)  by mouth 2 (two) times daily for 7 days. 14 capsule Danton Clap, PA-C     PDMP not reviewed this encounter.   Danton Clap, PA-C 11/14/20 [phone]

## 2020-11-16 ENCOUNTER — Other Ambulatory Visit: Payer: Self-pay

## 2020-11-16 ENCOUNTER — Ambulatory Visit
Admission: RE | Admit: 2020-11-16 | Discharge: 2020-11-16 | Disposition: A | Discharge status: Home / Self Care | Payer: Managed Care, Other (non HMO) | Source: Ambulatory Visit | Attending: Physician Assistant | Admitting: Physician Assistant

## 2020-11-16 DIAGNOSIS — Z1231 Encounter for screening mammogram for malignant neoplasm of breast: Secondary | ICD-10-CM

## 2020-12-02 ENCOUNTER — Other Ambulatory Visit: Payer: Self-pay | Admitting: Sports Medicine

## 2020-12-02 ENCOUNTER — Other Ambulatory Visit (HOSPITAL_COMMUNITY): Payer: Self-pay | Admitting: Sports Medicine

## 2020-12-02 DIAGNOSIS — G8929 Other chronic pain: Secondary | ICD-10-CM

## 2020-12-02 DIAGNOSIS — M778 Other enthesopathies, not elsewhere classified: Secondary | ICD-10-CM

## 2020-12-02 DIAGNOSIS — M79671 Pain in right foot: Secondary | ICD-10-CM

## 2020-12-19 ENCOUNTER — Ambulatory Visit
Admission: RE | Admit: 2020-12-19 | Discharge: 2020-12-19 | Disposition: A | Discharge status: Home / Self Care | Payer: Managed Care, Other (non HMO) | Source: Ambulatory Visit | Attending: Sports Medicine | Admitting: Sports Medicine

## 2020-12-19 ENCOUNTER — Other Ambulatory Visit: Payer: Self-pay

## 2020-12-19 DIAGNOSIS — M778 Other enthesopathies, not elsewhere classified: Secondary | ICD-10-CM | POA: Diagnosis present

## 2020-12-19 DIAGNOSIS — G8929 Other chronic pain: Secondary | ICD-10-CM

## 2020-12-19 DIAGNOSIS — M79671 Pain in right foot: Secondary | ICD-10-CM | POA: Diagnosis present

## 2021-04-27 ENCOUNTER — Encounter: Payer: Self-pay | Admitting: Dermatology

## 2021-04-27 ENCOUNTER — Ambulatory Visit (INDEPENDENT_AMBULATORY_CARE_PROVIDER_SITE_OTHER): Payer: Managed Care, Other (non HMO) | Admitting: Dermatology

## 2021-04-27 ENCOUNTER — Other Ambulatory Visit: Payer: Self-pay

## 2021-04-27 DIAGNOSIS — L821 Other seborrheic keratosis: Secondary | ICD-10-CM | POA: Diagnosis not present

## 2021-04-27 DIAGNOSIS — L578 Other skin changes due to chronic exposure to nonionizing radiation: Secondary | ICD-10-CM

## 2021-04-27 DIAGNOSIS — Z1283 Encounter for screening for malignant neoplasm of skin: Secondary | ICD-10-CM | POA: Diagnosis not present

## 2021-04-27 DIAGNOSIS — D1800 Hemangioma unspecified site: Secondary | ICD-10-CM

## 2021-04-27 DIAGNOSIS — L814 Other melanin hyperpigmentation: Secondary | ICD-10-CM

## 2021-04-27 DIAGNOSIS — D229 Melanocytic nevi, unspecified: Secondary | ICD-10-CM

## 2021-04-27 NOTE — Progress Notes (Signed)
   Follow-Up Visit   Subjective  Brittany Rivers is a 45 y.o. female who presents for the following: Annual Exam (Patient here today for a full body exam. Patient states she has mole on right shoulder she would like checked. Patient denies other concerns at follow up. She has no history of skin cancer. ).  Patient here for full body skin exam and skin cancer screening.  The following portions of the chart were reviewed this encounter and updated as appropriate:  Tobacco  Allergies  Meds  Problems  Med Hx  Surg Hx  Fam Hx       Objective  Well appearing patient in no apparent distress; mood and affect are within normal limits.  A full examination was performed including scalp, head, eyes, ears, nose, lips, neck, chest, axillae, abdomen, back, buttocks, bilateral upper extremities, bilateral lower extremities, hands, feet, fingers, toes, fingernails, and toenails. All findings within normal limits unless otherwise noted below.   Assessment & Plan  Lentigines - Scattered tan macules - Due to sun exposure - Benign-appering, observe - Recommend daily broad spectrum sunscreen SPF 30+ to sun-exposed areas, reapply every 2 hours as needed. - Call for any changes  Seborrheic Keratoses - Stuck-on, waxy, tan-brown papules and/or plaques at face and at posterior right shoulder - Benign-appearing - Discussed benign etiology and prognosis. - Observe - Call for any changes  Melanocytic Nevi - Tan-brown and/or pink-flesh-colored symmetric macules and papules - Benign appearing on exam today - Observation - Call clinic for new or changing moles - Recommend daily use of broad spectrum spf 30+ sunscreen to sun-exposed areas.   Hemangiomas - Red papules - Discussed benign nature - Observe - Call for any changes  Actinic Damage - Chronic condition, secondary to cumulative UV/sun exposure - diffuse scaly erythematous macules with underlying dyspigmentation - Recommend daily broad  spectrum sunscreen SPF 30+ to sun-exposed areas, reapply every 2 hours as needed.  - Staying in the shade or wearing long sleeves, sun glasses (UVA+UVB protection) and wide brim hats (4-inch brim around the entire circumference of the hat) are also recommended for sun protection.  - Call for new or changing lesions.  Skin cancer screening performed today.  Return for 1 - 2 year tbse . I, Ruthell Rummage, CMA, am acting as scribe for Forest Gleason, MD.  Documentation: I have reviewed the above documentation for accuracy and completeness, and I agree with the above.  Forest Gleason, MD

## 2021-04-27 NOTE — Patient Instructions (Signed)
Melanoma ABCDEs  Melanoma is the most dangerous type of skin cancer, and is the leading cause of death from skin disease.  You are more likely to develop melanoma if you: Have light-colored skin, light-colored eyes, or red or blond hair Spend a lot of time in the sun Tan regularly, either outdoors or in a tanning bed Have had blistering sunburns, especially during childhood Have a close family member who has had a melanoma Have atypical moles or large birthmarks  Early detection of melanoma is key since treatment is typically straightforward and cure rates are extremely high if we catch it early.   The first sign of melanoma is often a change in a mole or a new dark spot.  The ABCDE system is a way of remembering the signs of melanoma.  A for asymmetry:  The two halves do not match. B for border:  The edges of the growth are irregular. C for color:  A mixture of colors are present instead of an even brown color. D for diameter:  Melanomas are usually (but not always) greater than 34m - the size of a pencil eraser. E for evolution:  The spot keeps changing in size, shape, and color.  Please check your skin once per month between visits. You can use a small mirror in front and a large mirror behind you to keep an eye on the back side or your body.   If you see any new or changing lesions before your next follow-up, please call to schedule a visit.  Please continue daily skin protection including broad spectrum sunscreen SPF 30+ to sun-exposed areas, reapplying every 2 hours as needed when you're outdoors.   Staying in the shade or wearing long sleeves, sun glasses (UVA+UVB protection) and wide brim hats (4-inch brim around the entire circumference of the hat) are also recommended for sun protection.    Recommend taking Heliocare sun protection supplement daily in sunny weather for additional sun protection. For maximum protection on the sunniest days, you can take up to 2 capsules of  regular Heliocare OR take 1 capsule of Heliocare Ultra. For prolonged exposure (such as a full day in the sun), you can repeat your dose of the supplement 4 hours after your first dose. Heliocare can be purchased at [facility]or at [email or web address]    If you have any questions or concerns for your doctor, please call our main line at [phone] press option 4 to reach your doctor's medical assistant. If no one answers, please leave a voicemail as directed and we will return your call as soon as possible. Messages left after 4 pm will be answered the following business day.   You may also send [place] message via [place] We typically respond to MyChart messages within 1-2 business days.  For prescription refills, please ask your pharmacy to contact our office. Our fax number is [phone]  If you have an urgent issue when the clinic is closed that cannot wait until the next business day, you can page your doctor at the number below.    Please note that while we do our best to be available for urgent issues outside of office hours, we are not available 24/7.   If you have an urgent issue and are unable to reach [place] you may choose to seek medical care at your doctor's office, retail clinic, urgent care center, or emergency room.  If you have a medical emergency, please immediately call 911 or go to  the emergency department.  Pager Numbers  - Dr. Nehemiah Massed: [phone]  - Dr. Laurence Ferrari: [phone]  - Dr. Nicole Kindred: [phone]  In the event of inclement weather, please call our main line at [phone] for an update on the status of any delays or closures.  Dermatology Medication Tips: Please keep the boxes that topical medications come in in order to help keep track of the instructions about where and how to use these. Pharmacies typically print the medication instructions only on the boxes and not directly on the medication tubes.   If your medication is too expensive,  please contact our office at [phone] option 4 or send [place] a message through [place].   We are unable to tell what your co-pay for medications will be in advance as this is different depending on your insurance coverage. However, we may be able to find a substitute medication at lower cost or fill out paperwork to get insurance to cover a needed medication.   If a prior authorization is required to get your medication covered by your insurance company, please allow [place] 1-2 business days to complete this process.  Drug prices often vary depending on where the prescription is filled and some pharmacies may offer cheaper prices.  The website www.goodrx.com contains coupons for medications through different pharmacies. The prices here do not account for what the cost may be with help from insurance (it may be cheaper with your insurance), but the website can give you the price if you did not use any insurance.  - You can print the associated coupon and take it with your prescription to the pharmacy.  - You may also stop by our office during regular business hours and pick up a GoodRx coupon card.  - If you need your prescription sent electronically to a different pharmacy, notify our office through [facility] or by phone at [phone] option 4.

## 2021-06-07 LAB — COLOGUARD: COLOGUARD: NEGATIVE

## 2021-07-28 IMAGING — US US ABDOMEN LIMITED
1 series · 14 of 25 positions shown · non-contrast
Comparison: None.

CLINICAL DATA: Indigestion

EXAM:
ULTRASOUND ABDOMEN LIMITED RIGHT UPPER QUADRANT

[Series 1: us abdomen limited · 0.20mm/px · 14 of 46 slices shown]
[im 1/46]
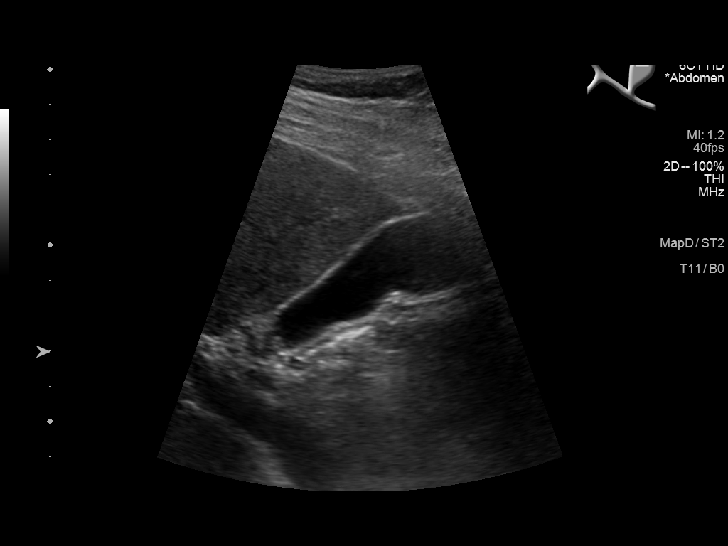
[im 4/46]
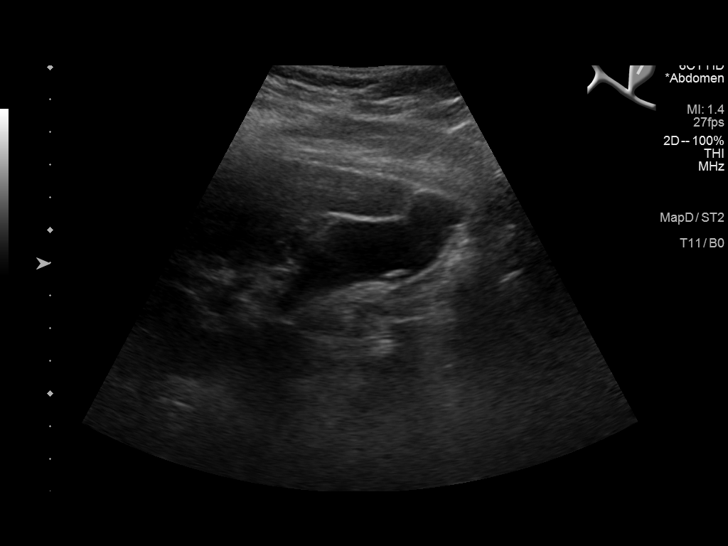
[im 8/46]
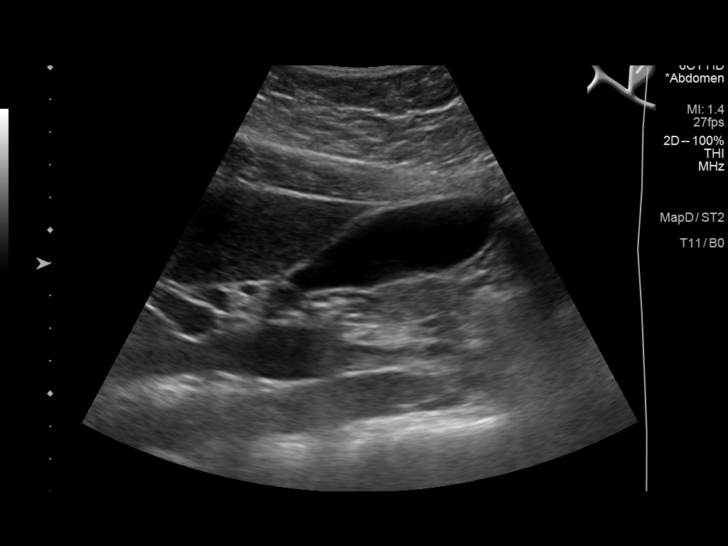
[im 12/46]
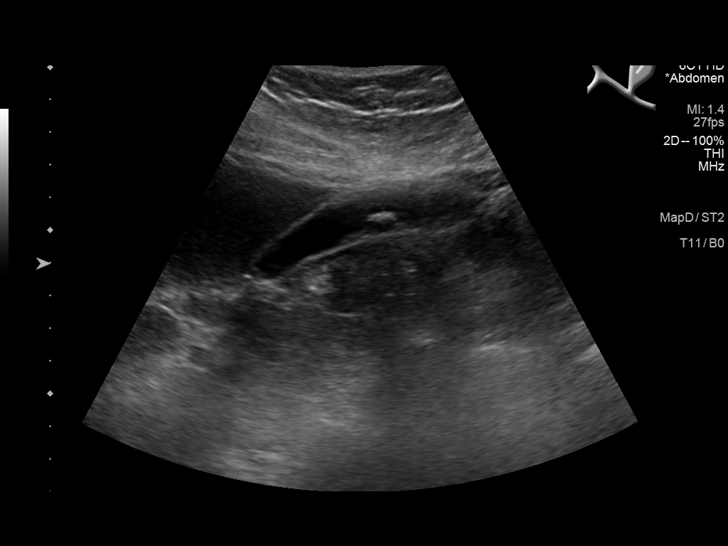
[im 16/46]
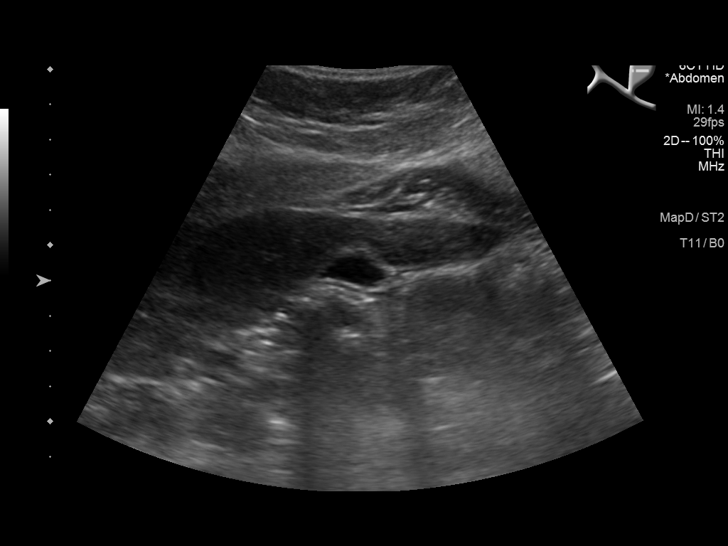
[im 17/46]
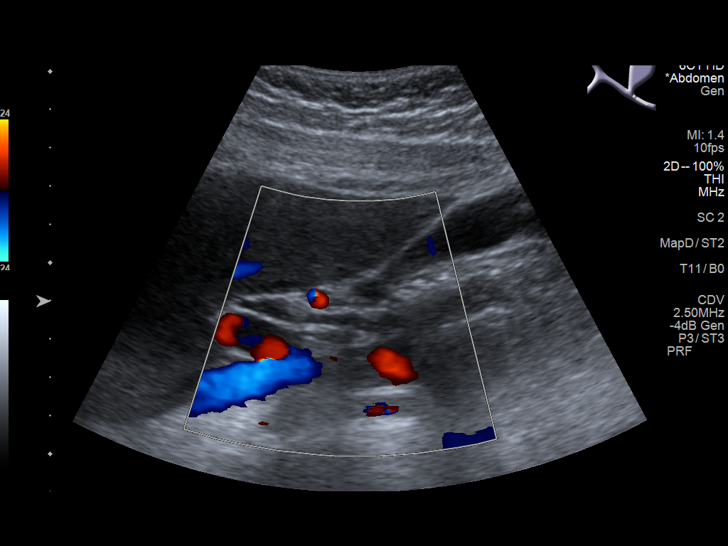
[im 21/46]
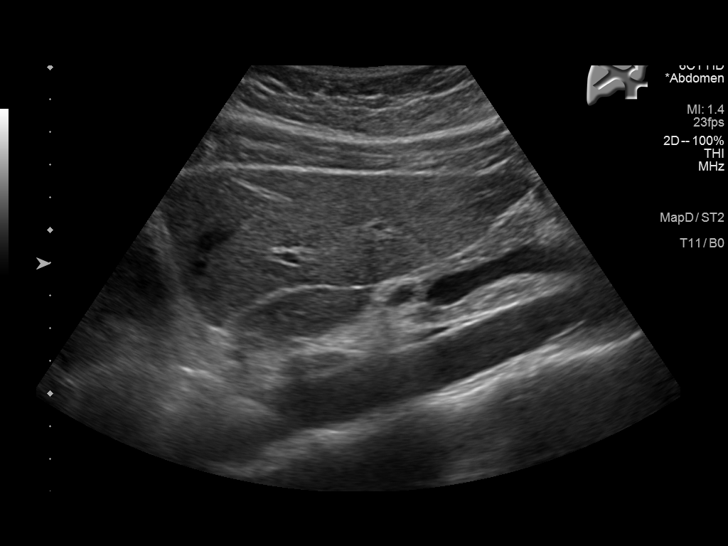
[im 25/46]
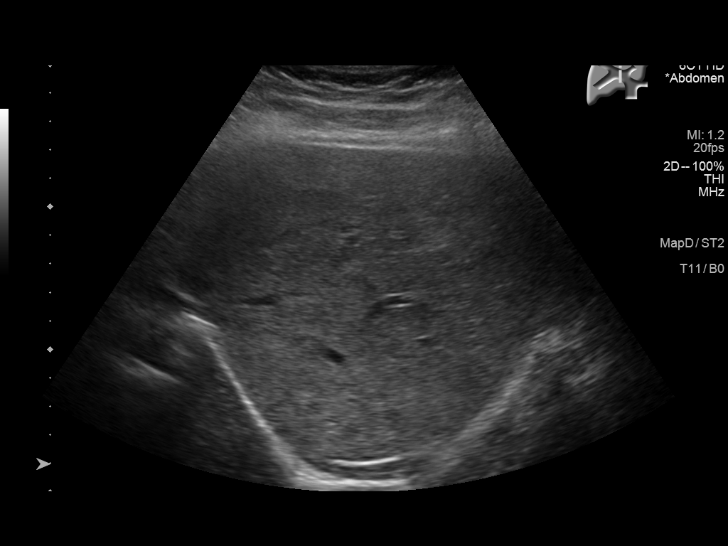
[im 29/46]
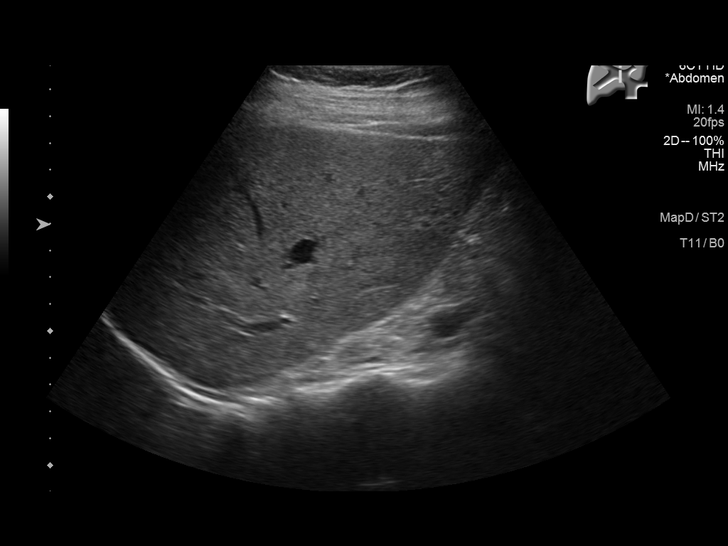
[im 31/46]
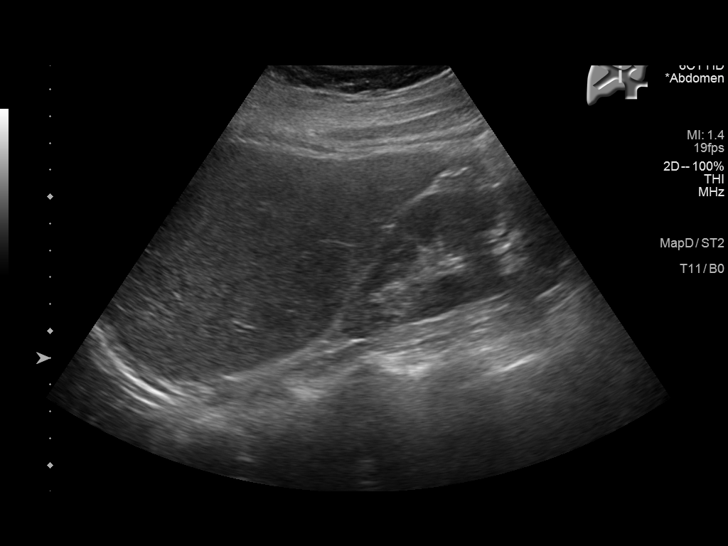
[im 34/46]
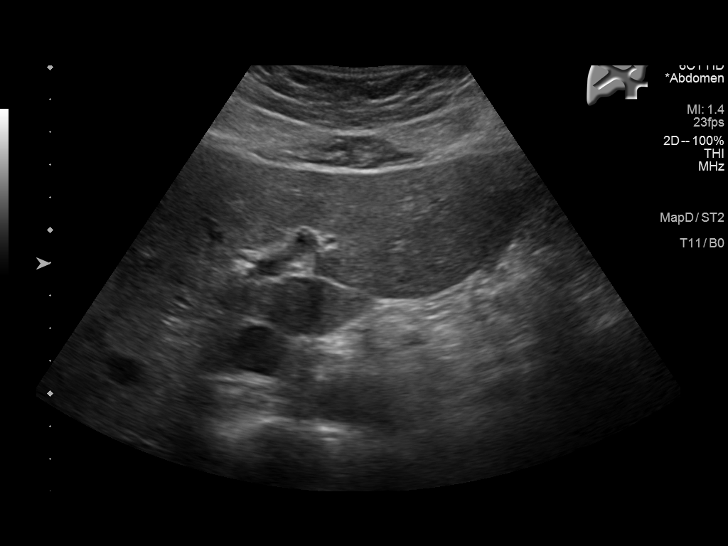
[im 38/46]
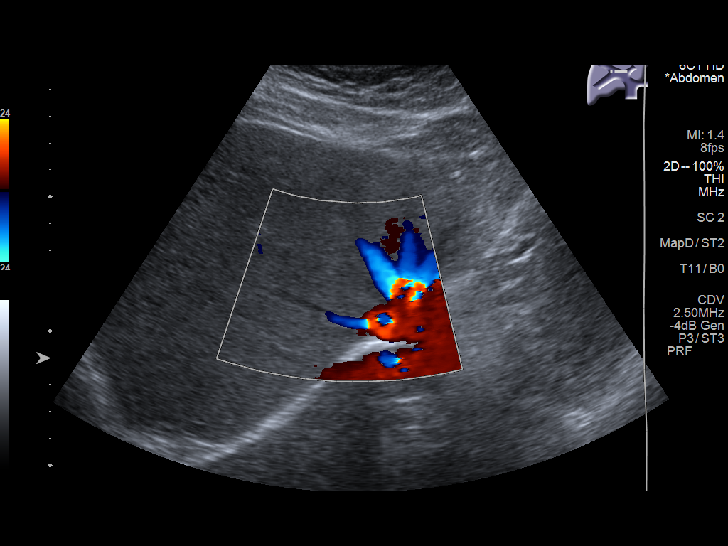
[im 42/46]
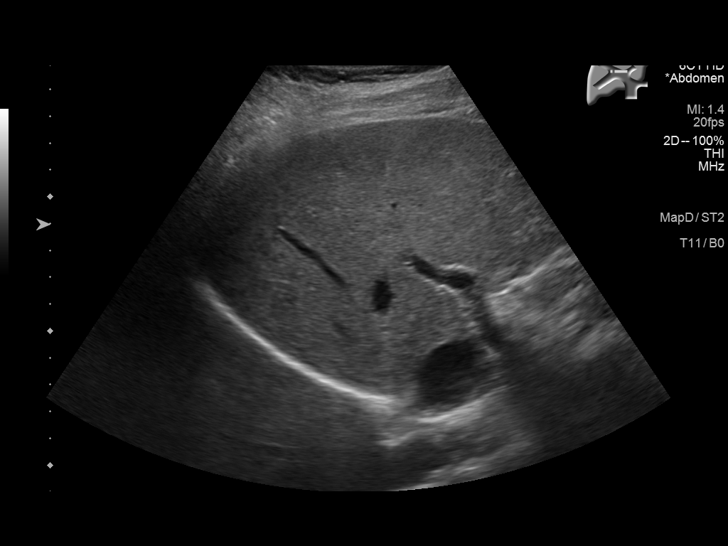
[im 46/46]
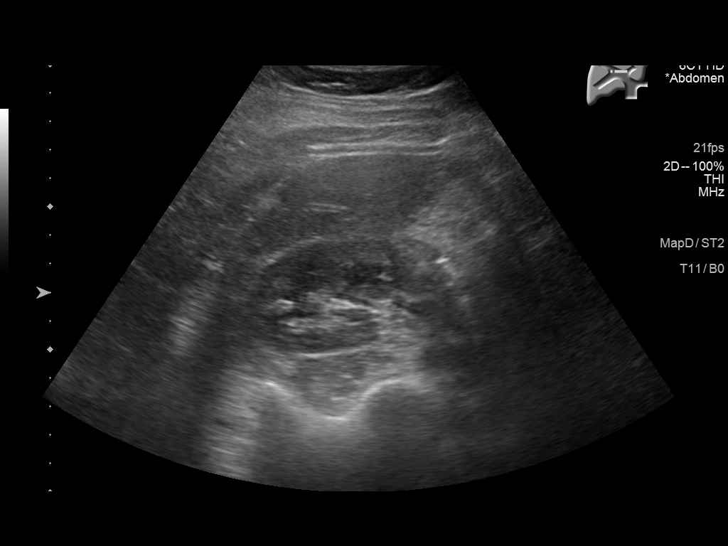

[14 of 25 positions shown; findings below may reference images not displayed]

FINDINGS: Gallbladder:

Cholelithiasis with the largest gallstone measuring 9 mm. No
pericholecystic fluid or gallbladder wall thickening. Negative
sonographic Murphy sign.

Common bile duct:

Diameter: 3 mm

Liver:

No focal lesion identified. Within normal limits in parenchymal
echogenicity. Portal vein is patent on color Doppler imaging with
normal direction of blood flow towards the liver.

Other: None.
IMPRESSION: Cholelithiasis without sonographic evidence of acute cholecystitis.

## 2021-10-02 ENCOUNTER — Other Ambulatory Visit: Payer: Self-pay | Admitting: Physician Assistant

## 2021-10-02 DIAGNOSIS — Z1231 Encounter for screening mammogram for malignant neoplasm of breast: Secondary | ICD-10-CM

## 2021-11-21 ENCOUNTER — Ambulatory Visit
Admission: RE | Admit: 2021-11-21 | Discharge: 2021-11-21 | Disposition: A | Discharge status: Home / Self Care | Payer: Managed Care, Other (non HMO) | Source: Ambulatory Visit | Attending: Physician Assistant | Admitting: Physician Assistant

## 2021-11-21 ENCOUNTER — Other Ambulatory Visit: Payer: Self-pay

## 2021-11-21 DIAGNOSIS — Z1231 Encounter for screening mammogram for malignant neoplasm of breast: Secondary | ICD-10-CM | POA: Insufficient documentation

## 2022-01-01 ENCOUNTER — Ambulatory Visit
Admission: RE | Admit: 2022-01-01 | Discharge: 2022-01-01 | Disposition: A | Discharge status: Home / Self Care | Payer: Managed Care, Other (non HMO) | Source: Ambulatory Visit

## 2022-01-01 VITALS — BP 134/90 | HR 87 | Temp 98.5°F | Resp 18 | Ht 67.0 in | Wt 175.0 lb

## 2022-01-01 DIAGNOSIS — U071 COVID-19: Secondary | ICD-10-CM

## 2022-01-01 DIAGNOSIS — J029 Acute pharyngitis, unspecified: Secondary | ICD-10-CM | POA: Diagnosis not present

## 2022-01-01 LAB — GROUP A STREP BY PCR: Group A Strep by PCR: NOT DETECTED

## 2022-01-01 MED ORDER — LIDOCAINE VISCOUS HCL 2 % MT SOLN: 15.0000 mL | Freq: Four times a day (QID) | OROMUCOSAL | 0 refills | Status: AC | PRN

## 2022-01-01 NOTE — ED Provider Notes (Signed)
?[facility] ? ? ? ?CSN: [id number] ?Arrival date & time: 01/01/22  1457 ? ? ?  ? ?History   ?Chief Complaint ?Chief Complaint  ?Patient presents with  ? Sore Throat  ?  Tested positive at home for Covid on Saturday, but main symptom is just a very sore throat - Entered by patient. ?Appt.   ? ? ?HPI ?Brittany Rivers is a 46 y.o. female.  ? ?Patient presents today with a several day history of URI symptoms.  Reports some nasal congestion as well as sore throat.  Reports body aches and subjective fever.  She denies any chest pain, shortness of breath, nausea, vomiting.  She is eating and drinking normally despite sore throat symptoms.  Reports sore throat pain is rated at 6 on a 0-10 pain scale, described as aching, no aggravating relieving factors identified.  Reports husband was sick with COVID-19.  Patient took a COVID test at the onset of symptoms and was positive but is concerned she may have a secondary infection of her throat given severity of pain.  She has not tried any over-the-counter medication for symptom management.  Denies any recent antibiotic use.  Denies history of allergies, asthma, COPD, smoking.  She has not had COVID in the past.  She has not had COVID-vaccine. ? ? ?Past Medical History:  ?Diagnosis Date  ? Palpitations   ? Rupture of anterior cruciate ligament of left knee   ? ? ?Patient Active Problem List  ? Diagnosis Date Noted  ? SOBOE (shortness of breath on exertion) 08/22/2020  ? Palpitations 06/21/2016  ? Rupture of anterior cruciate ligament of left knee 06/06/2016  ? Left knee pain 06/06/2016  ? ? ?Past Surgical History:  ?Procedure Laterality Date  ? ANTERIOR CRUCIATE LIGAMENT REPAIR    ? BUNIONECTOMY    ? CESAREAN SECTION    ? ? ?OB History   ?No obstetric history on file. ?  ? ? ? ?Home Medications   ? ?Prior to Admission medications   ?Medication Sig Start Date End Date Taking? Authorizing Provider  ?acetaminophen (TYLENOL) 325 MG tablet Take 650 mg by mouth every 6 (six)  hours as needed.   Yes [provider]  ?Fluocinolone Acetonide 0.01 % OIL PUT 3-4 DROPS IN EACH EAR THREE TIMES DAILY AS NEEDED FOR ITCHING FOR UP TO TWO WEEKS. 07/12/21  Yes [provider]  ?ibuprofen (ADVIL) 200 MG tablet Take 200 mg by mouth every 6 (six) hours as needed.   Yes [provider]  ?lidocaine (XYLOCAINE) 2 % solution Use as directed 15 mLs in the mouth or throat every 6 (six) hours as needed for mouth pain. 01/01/22  Yes Insiya Oshea, Derry Skill, PA-C  ?LORazepam (ATIVAN) 0.5 MG tablet TAKE 1/2  1 TABLET BY MOUTH AT BEDTIME AS NEEDED 10/07/19  Yes [provider]  ?Fluocinolone Acetonide 0.01 % OIL SMARTSIG:3-4 Drop(s) In Ear(s) 3 Times Daily PRN 07/12/21   [provider]  ? ? ?Family History ?Family History  ?Problem Relation Age of Onset  ? Breast cancer Neg Hx   ? ? ?Social History ?Social History  ? ?Tobacco Use  ? Smoking status: Never  ? Smokeless tobacco: Never  ?Vaping Use  ? Vaping Use: Never used  ?Substance Use Topics  ? Alcohol use: No  ? Drug use: Never  ? ? ? ?Allergies   ?Penicillins ? ? ?Review of Systems ?Review of Systems  ?Constitutional:  Positive for activity change, fatigue and fever. Negative for appetite  change.  ?HENT:  Positive for congestion and sore throat. Negative for sinus pressure and sneezing.   ?Respiratory:  Negative for cough and shortness of breath.   ?Cardiovascular:  Negative for chest pain.  ?Gastrointestinal:  Negative for abdominal pain, diarrhea, nausea and vomiting.  ?Neurological:  Negative for dizziness, light-headedness and headaches.  ? ? ?Physical Exam ?Triage Vital Signs ?ED Triage Vitals  ?Enc Vitals Group  ?   BP 01/01/22 1600 134/90  ?   Pulse Rate 01/01/22 1600 87  ?   Resp 01/01/22 1558 18  ?   Temp 01/01/22 1558 98.5 ?F (36.9 ?C)  ?   Temp Source 01/01/22 1558 Oral  ?   SpO2 01/01/22 1600 99 %  ?   Weight 01/01/22 1556 175 lb (79.4 kg)  ?   Height 01/01/22 1556 5' 7"  (1.702 m)  ?   Head Circumference --   ?    Peak Flow --   ?   Pain Score 01/01/22 1553 6  ?   Pain Loc --   ?   Pain Edu? --   ?   Excl. in [place]? --   ? ?No data found. ? ?Updated Vital Signs ?BP 134/90 (BP Location: Left Arm)   Pulse 87   Temp 98.5 ?F (36.9 ?C) (Oral)   Resp 18   Ht 5' 7"  (1.702 m)   Wt 175 lb (79.4 kg)   LMP 12/11/2021 (Approximate)   SpO2 99%   BMI 27.41 kg/m?  ? ?Visual Acuity ?Right Eye Distance:   ?Left Eye Distance:   ?Bilateral Distance:   ? ?Right Eye Near:   ?Left Eye Near:    ?Bilateral Near:    ? ?Physical Exam ?Vitals reviewed.  ?Constitutional:   ?   General: She is awake. She is not in acute distress. ?   Appearance: Normal appearance. She is well-developed. She is not ill-appearing.  ?   Comments: Very pleasant female appears stated age in no acute distress sitting comfortably in exam room  ?HENT:  ?   Head: Normocephalic and atraumatic.  ?   Right Ear: Tympanic membrane, ear canal and external ear normal. Tympanic membrane is not erythematous or bulging.  ?   Left Ear: Tympanic membrane, ear canal and external ear normal. Tympanic membrane is not erythematous or bulging.  ?   Nose:  ?   Right Sinus: No maxillary sinus tenderness or frontal sinus tenderness.  ?   Left Sinus: No maxillary sinus tenderness or frontal sinus tenderness.  ?   Mouth/Throat:  ?   Pharynx: Uvula midline. Posterior oropharyngeal erythema present. No oropharyngeal exudate.  ?Cardiovascular:  ?   Rate and Rhythm: Normal rate and regular rhythm.  ?   Heart sounds: Normal heart sounds, S1 normal and S2 normal. No murmur heard. ?Pulmonary:  ?   Effort: Pulmonary effort is normal.  ?   Breath sounds: Normal breath sounds. No wheezing, rhonchi or rales.  ?   Comments: Clear auscultation bilaterally ?Psychiatric:     ?   Behavior: Behavior is cooperative.  ? ? ? ?UC Treatments / Results  ?Labs ?(all labs ordered are listed, but only abnormal results are displayed) ?Labs Reviewed  ?GROUP A STREP BY PCR  ? ? ?EKG ? ? ?Radiology ?No results  found. ? ?Procedures ?Procedures (including critical care time) ? ?Medications Ordered in UC ?Medications - No data to display ? ?Initial Impression / Assessment and Plan / UC Course  ?I have reviewed the triage vital  signs and the nursing notes. ? ?Pertinent labs & imaging results that were available during my care of the patient were reviewed by me and considered in my medical decision making (see chart for details). ? ?  ? ?Patient is well-appearing, afebrile, nontoxic, nontachycardic.  Strep testing was negative in clinic today.  Discussed symptoms are likely related to COVID-19 infection.  No evidence of acute infection on physical exam that would warrant initiation of antibiotics.  Patient was encouraged to use conservative treatment measures including gargle with warm salt water and alternate Tylenol and ibuprofen for pain relief.  She was given lidocaine to manage sore throat symptoms with instruction not to eat or drink immediately after using this medication due to increased risk of choking.  Discussed that she should rest and drink plenty of fluid.  She has no significant risk factors for COVID-19 and so we will defer antiviral medication.  Discussed that if she has any worsening symptoms including high fever, chest pain, shortness of breath, nausea/vomiting, difficulty speaking, difficulty swallowing, swelling of her throat she needs to go to the emergency room immediately.  Strict return precautions given.  Work excuse note provided. ? ?Final Clinical Impressions(s) / UC Diagnoses  ? ?Final diagnoses:  ?COVID-19  ?Pharyngitis, unspecified etiology  ?Sore throat  ? ? ? ?Discharge Instructions   ? ?  ?Your strep testing is negative.  I suspect that your ongoing sore throat is related to COVID.  You do not have significant risk factors for COVID-19 so we do not need to start antiviral medication.  I would use symptomatic treatment.  I have called and lidocaine 2% that you can use every 6 hours as needed.   Do not eat or drink immediately after using this medication as it increases your risk of choking.  Gargle with warm salt water and use Tylenol and ibuprofen for symptom relief.  If you develop any worsening symptoms includ

## 2022-01-01 NOTE — ED Triage Notes (Signed)
Patient is here for Sore throat. Tested + for COVID19 "home test" on Saturday. No fever.  ?

## 2022-01-01 NOTE — Discharge Instructions (Signed)
Your strep testing is negative.  I suspect that your ongoing sore throat is related to COVID.  You do not have significant risk factors for COVID-19 so we do not need to start antiviral medication.  I would use symptomatic treatment.  I have called and lidocaine 2% that you can use every 6 hours as needed.  Do not eat or drink immediately after using this medication as it increases your risk of choking.  Gargle with warm salt water and use Tylenol and ibuprofen for symptom relief.  If you develop any worsening symptoms including difficulty speaking, difficulty swallowing, shortness of breath, swelling of your throat you should be seen immediately. ?

## 2022-05-02 ENCOUNTER — Encounter: Payer: Managed Care, Other (non HMO) | Admitting: Dermatology

## 2022-07-26 ENCOUNTER — Encounter: Payer: Managed Care, Other (non HMO) | Admitting: Dermatology

## 2022-09-07 ENCOUNTER — Other Ambulatory Visit: Payer: Self-pay | Admitting: Physician Assistant

## 2022-09-07 DIAGNOSIS — Z1231 Encounter for screening mammogram for malignant neoplasm of breast: Secondary | ICD-10-CM

## 2022-09-27 ENCOUNTER — Ambulatory Visit: Payer: Managed Care, Other (non HMO) | Admitting: Dermatology

## 2022-09-27 ENCOUNTER — Encounter: Payer: Self-pay | Admitting: Dermatology

## 2022-09-27 DIAGNOSIS — L821 Other seborrheic keratosis: Secondary | ICD-10-CM | POA: Diagnosis not present

## 2022-09-27 DIAGNOSIS — D229 Melanocytic nevi, unspecified: Secondary | ICD-10-CM | POA: Diagnosis not present

## 2022-09-27 DIAGNOSIS — Z1283 Encounter for screening for malignant neoplasm of skin: Secondary | ICD-10-CM | POA: Diagnosis not present

## 2022-09-27 DIAGNOSIS — D492 Neoplasm of unspecified behavior of bone, soft tissue, and skin: Secondary | ICD-10-CM

## 2022-09-27 DIAGNOSIS — L578 Other skin changes due to chronic exposure to nonionizing radiation: Secondary | ICD-10-CM

## 2022-09-27 DIAGNOSIS — D239 Other benign neoplasm of skin, unspecified: Secondary | ICD-10-CM

## 2022-09-27 DIAGNOSIS — D225 Melanocytic nevi of trunk: Secondary | ICD-10-CM

## 2022-09-27 DIAGNOSIS — L814 Other melanin hyperpigmentation: Secondary | ICD-10-CM

## 2022-09-27 HISTORY — DX: Other benign neoplasm of skin, unspecified: D23.9

## 2022-09-27 NOTE — Patient Instructions (Addendum)
Wound Care Instructions  Cleanse wound gently with soap and water once a day then pat dry with clean gauze. Apply a thin coat of Petrolatum (petroleum jelly, "Vaseline") over the wound (unless you have an allergy to this). We recommend that you use a new, sterile tube of Vaseline. Do not pick or remove scabs. Do not remove the yellow or white "healing tissue" from the base of the wound.  Cover the wound with fresh, clean, nonstick gauze and secure with paper tape. You may use Band-Aids in place of gauze and tape if the wound is small enough, but would recommend trimming much of the tape off as there is often too much. Sometimes Band-Aids can irritate the skin.  You should call the office for your biopsy report after 1 week if you have not already been contacted.  If you experience any problems, such as abnormal amounts of bleeding, swelling, significant bruising, significant pain, or evidence of infection, please call the office immediately.  FOR ADULT SURGERY PATIENTS: If you need something for pain relief you may take 1 extra strength Tylenol (acetaminophen) AND 2 Ibuprofen (200mg each) together every 4 hours as needed for pain. (do not take these if you are allergic to them or if you have a reason you should not take them.) Typically, you may only need pain medication for 1 to 3 days.   Melanoma ABCDEs  Melanoma is the most dangerous type of skin cancer, and is the leading cause of death from skin disease.  You are more likely to develop melanoma if you: Have light-colored skin, light-colored eyes, or red or blond hair Spend a lot of time in the sun Tan regularly, either outdoors or in a tanning bed Have had blistering sunburns, especially during childhood Have a close family member who has had a melanoma Have atypical moles or large birthmarks  Early detection of melanoma is key since treatment is typically straightforward and cure rates are extremely high if we catch it early.   The  first sign of melanoma is often a change in a mole or a new dark spot.  The ABCDE system is a way of remembering the signs of melanoma.  A for asymmetry:  The two halves do not match. B for border:  The edges of the growth are irregular. C for color:  A mixture of colors are present instead of an even brown color. D for diameter:  Melanomas are usually (but not always) greater than 6mm - the size of a pencil eraser. E for evolution:  The spot keeps changing in size, shape, and color.  Please check your skin once per month between visits. You can use a small mirror in front and a large mirror behind you to keep an eye on the back side or your body.   If you see any new or changing lesions before your next follow-up, please call to schedule a visit.  Please continue daily skin protection including broad spectrum sunscreen SPF 30+ to sun-exposed areas, reapplying every 2 hours as needed when you're outdoors.    Due to recent changes in healthcare laws, you may see results of your pathology and/or laboratory studies on MyChart before the doctors have had a chance to review them. We understand that in some cases there may be results that are confusing or concerning to you. Please understand that not all results are received at the same time and often the doctors may need to interpret multiple results in order to provide you   with the best plan of care or course of treatment. Therefore, we ask that you please give us 2 business days to thoroughly review all your results before contacting the office for clarification. Should we see a critical lab result, you will be contacted sooner.   If You Need Anything After Your Visit  If you have any questions or concerns for your doctor, please call our main line at [phone] and press option 4 to reach your doctor's medical assistant. If no one answers, please leave a voicemail as directed and we will return your call as soon as possible. Messages left after 4  pm will be answered the following business day.   You may also send us a message via MyChart. We typically respond to MyChart messages within 1-2 business days.  For prescription refills, please ask your pharmacy to contact our office. Our fax number is [phone].  If you have an urgent issue when the clinic is closed that cannot wait until the next business day, you can page your doctor at the number below.    Please note that while we do our best to be available for urgent issues outside of office hours, we are not available 24/7.   If you have an urgent issue and are unable to reach us, you may choose to seek medical care at your doctor's office, retail clinic, urgent care center, or emergency room.  If you have a medical emergency, please immediately call 911 or go to the emergency department.  Pager Numbers  - Dr. Kowalski: [phone]  - Dr. Moye: [phone]  - Dr. Stewart: [phone]  In the event of inclement weather, please call our main line at [phone] for an update on the status of any delays or closures.  Dermatology Medication Tips: Please keep the boxes that topical medications come in in order to help keep track of the instructions about where and how to use these. Pharmacies typically print the medication instructions only on the boxes and not directly on the medication tubes.   If your medication is too expensive, please contact our office at [phone] option 4 or send us a message through MyChart.   We are unable to tell what your co-pay for medications will be in advance as this is different depending on your insurance coverage. However, we may be able to find a substitute medication at lower cost or fill out paperwork to get insurance to cover a needed medication.   If a prior authorization is required to get your medication covered by your insurance company, please allow us 1-2 business days to complete this process.  Drug prices often vary  depending on where the prescription is filled and some pharmacies may offer cheaper prices.  The website www.goodrx.com contains coupons for medications through different pharmacies. The prices here do not account for what the cost may be with help from insurance (it may be cheaper with your insurance), but the website can give you the price if you did not use any insurance.  - You can print the associated coupon and take it with your prescription to the pharmacy.  - You may also stop by our office during regular business hours and pick up a GoodRx coupon card.  - If you need your prescription sent electronically to a different pharmacy, notify our office through [facility] MyChart or by phone at [phone] option 4.     Si Usted Necesita Algo Despus de Su Visita  Tambin puede enviarnos un mensaje a travs   de MyChart. Por lo general respondemos a los mensajes de MyChart en el transcurso de 1 a 2 das hbiles.  Para renovar recetas, por favor pida a su farmacia que se ponga en contacto con nuestra oficina. Nuestro nmero de fax es el [phone].  Si tiene un asunto urgente cuando la clnica est cerrada y que no puede esperar hasta el siguiente da hbil, puede llamar/localizar a su doctor(a) al nmero que aparece a continuacin.   Por favor, tenga en cuenta que aunque hacemos todo lo posible para estar disponibles para asuntos urgentes fuera del horario de oficina, no estamos disponibles las 24 horas del da, los 7 das de la semana.   Si tiene un problema urgente y no puede comunicarse con nosotros, puede optar por buscar atencin mdica  en el consultorio de su doctor(a), en una clnica privada, en un centro de atencin urgente o en una sala de emergencias.  Si tiene una emergencia mdica, por favor llame inmediatamente al 911 o vaya a la sala de emergencias.  Nmeros de bper  - Dr. Kowalski: [phone]  - Dra. Moye: [phone]  - Dra. Stewart: [phone]  En caso de  inclemencias del tiempo, por favor llame a nuestra lnea principal al [phone] para una actualizacin sobre el estado de cualquier retraso o cierre.  Consejos para la medicacin en dermatologa: Por favor, guarde las cajas en las que vienen los medicamentos de uso tpico para ayudarle a seguir las instrucciones sobre dnde y cmo usarlos. Las farmacias generalmente imprimen las instrucciones del medicamento slo en las cajas y no directamente en los tubos del medicamento.   Si su medicamento es muy caro, por favor, pngase en contacto con nuestra oficina llamando al [phone] y presione la opcin 4 o envenos un mensaje a travs de MyChart.   No podemos decirle cul ser su copago por los medicamentos por adelantado ya que esto es diferente dependiendo de la cobertura de su seguro. Sin embargo, es posible que podamos encontrar un medicamento sustituto a menor costo o llenar un formulario para que el seguro cubra el medicamento que se considera necesario.   Si se requiere una autorizacin previa para que su compaa de seguros cubra su medicamento, por favor permtanos de 1 a 2 das hbiles para completar este proceso.  Los precios de los medicamentos varan con frecuencia dependiendo del lugar de dnde se surte la receta y alguna farmacias pueden ofrecer precios ms baratos.  El sitio web www.goodrx.com tiene cupones para medicamentos de diferentes farmacias. Los precios aqu no tienen en cuenta lo que podra costar con la ayuda del seguro (puede ser ms barato con su seguro), pero el sitio web puede darle el precio si no utiliz ningn seguro.  - Puede imprimir el cupn correspondiente y llevarlo con su receta a la farmacia.  - Tambin puede pasar por nuestra oficina durante el horario de atencin regular y recoger una tarjeta de cupones de GoodRx.  - Si necesita que su receta se enve electrnicamente a una farmacia diferente, informe a nuestra oficina a travs de MyChart de [facility] o  por telfono llamando al [phone] y presione la opcin 4.  

## 2022-09-27 NOTE — Progress Notes (Signed)
   Follow-Up Visit   Subjective  Brittany Rivers is a 47 y.o. female who presents for the following: FBSE (No hx skin cancer).  The patient presents for Total-Body Skin Exam (TBSE) for skin cancer screening and mole check.  The patient has spots, moles and lesions to be evaluated, some may be new or changing and the patient has concerns that these could be cancer.  The following portions of the chart were reviewed this encounter and updated as appropriate:   Tobacco  Allergies  Meds  Problems  Med Hx  Surg Hx  Fam Hx      Review of Systems:  No other skin or systemic complaints except as noted in HPI or Assessment and Plan.  Objective  Well appearing patient in no apparent distress; mood and affect are within normal limits.  A full examination was performed including scalp, head, eyes, ears, nose, lips, neck, chest, axillae, abdomen, back, buttocks, bilateral upper extremities, bilateral lower extremities, hands, feet, fingers, toes, fingernails, and toenails. All findings within normal limits unless otherwise noted below.  Upper Mid Back 0.5 cm pink and brown thin papule R/o Atypia vs Dermatofibroma       Assessment & Plan  Neoplasm of skin Upper Mid Back  Epidermal / dermal shaving  Lesion diameter (cm):  0.5 Informed consent: discussed and consent obtained   Timeout: patient name, date of birth, surgical site, and procedure verified   Anesthesia: the lesion was anesthetized in a standard fashion   Anesthetic:  1% lidocaine w/ epinephrine 1-100,000 local infiltration Instrument used: DermaBlade   Hemostasis achieved with: aluminum chloride   Outcome: patient tolerated procedure well   Post-procedure details: wound care instructions given   Additional details:  Mupirocin and a bandage applied  Related Procedures Anatomic Pathology Report   Lentigines - Scattered tan macules - Due to sun exposure - Benign-appearing, observe - Recommend daily broad spectrum  sunscreen SPF 30+ to sun-exposed areas, reapply every 2 hours as needed. - Call for any changes  Seborrheic Keratoses - Stuck-on, waxy, tan-brown papules and/or plaques  - Benign-appearing - Discussed benign etiology and prognosis. - Observe - Call for any changes  Melanocytic Nevi - Tan-brown and/or pink-flesh-colored symmetric macules and papules - Benign appearing on exam today - Observation - Call clinic for new or changing moles - Recommend daily use of broad spectrum spf 30+ sunscreen to sun-exposed areas.   Hemangiomas - Red papules - Discussed benign nature - Observe - Call for any changes  Actinic Damage - Chronic condition, secondary to cumulative UV/sun exposure - diffuse scaly erythematous macules with underlying dyspigmentation - Recommend daily broad spectrum sunscreen SPF 30+ to sun-exposed areas, reapply every 2 hours as needed.  - Staying in the shade or wearing long sleeves, sun glasses (UVA+UVB protection) and wide brim hats (4-inch brim around the entire circumference of the hat) are also recommended for sun protection.  - Call for new or changing lesions.  Skin cancer screening performed today.  Return for 1-2 years FBSE.  Graciella Belton, RMA, am acting as scribe for Forest Gleason, MD .  Documentation: I have reviewed the above documentation for accuracy and completeness, and I agree with the above.  Forest Gleason, MD

## 2022-10-02 ENCOUNTER — Telehealth: Payer: Self-pay

## 2022-10-02 LAB — ANATOMIC PATHOLOGY REPORT

## 2022-10-02 NOTE — Telephone Encounter (Signed)
Left pt msg to call for bx result/sh °

## 2022-10-02 NOTE — Telephone Encounter (Signed)
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 recommend annual FBSE  This is a MILDLY ATYPICAL MOLE. On the spectrum from normal mole to melanoma skin cancer, this is in between but it is much closer to a normal mole.  - These typically do not progress to melanoma or cause any trouble.  - People who have a history of atypical moles do have a slightly increased risk of developing melanoma somewhere on the body, so a yearly full body skin exam by a dermatologist is recommended.  - Monthly self skin checks and daily sun protection are also recommended.  - Please call if you notice a dark spot coming back where this biopsy was taken.  - Please also call if you notice any new or changing spots anywhere else on the body before your follow-up visit.  - Please call our office or send [place] a message if you have any questions or concerns about this biopsy result.   MAs please call. Thank you!

## 2022-10-03 ENCOUNTER — Telehealth: Payer: Self-pay

## 2022-10-03 NOTE — Telephone Encounter (Signed)
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 recommend annual FBSE  This is a MILDLY ATYPICAL MOLE. On the spectrum from normal mole to melanoma skin cancer, this is in between but it is much closer to a normal mole.  - These typically do not progress to melanoma or cause any trouble.  - People who have a history of atypical moles do have a slightly increased risk of developing melanoma somewhere on the body, so a yearly full body skin exam by a dermatologist is recommended.  - Monthly self skin checks and daily sun protection are also recommended.  - Please call if you notice a dark spot coming back where this biopsy was taken.  - Please also call if you notice any new or changing spots anywhere else on the body before your follow-up visit.  - Please call our office or send [place] a message if you have any questions or concerns about this biopsy result.   MAs please call. Thank you!

## 2022-10-03 NOTE — Telephone Encounter (Signed)
Patient advised of BX results .aw 

## 2022-10-08 ENCOUNTER — Encounter: Payer: Self-pay | Admitting: Dermatology

## 2022-11-26 ENCOUNTER — Ambulatory Visit
Admission: RE | Admit: 2022-11-26 | Discharge: 2022-11-26 | Disposition: A | Discharge status: Home / Self Care | Payer: Managed Care, Other (non HMO) | Source: Ambulatory Visit | Attending: Physician Assistant | Admitting: Physician Assistant

## 2022-11-26 DIAGNOSIS — Z1231 Encounter for screening mammogram for malignant neoplasm of breast: Secondary | ICD-10-CM | POA: Diagnosis not present

## 2023-03-01 ENCOUNTER — Ambulatory Visit
Admission: RE | Admit: 2023-03-01 | Discharge: 2023-03-01 | Disposition: A | Discharge status: Home / Self Care | Payer: Managed Care, Other (non HMO) | Source: Ambulatory Visit | Attending: Emergency Medicine | Admitting: Emergency Medicine

## 2023-03-01 VITALS — BP 118/81 | HR 76 | Temp 98.4°F | Ht 67.0 in | Wt 169.0 lb

## 2023-03-01 DIAGNOSIS — B9689 Other specified bacterial agents as the cause of diseases classified elsewhere: Secondary | ICD-10-CM | POA: Diagnosis not present

## 2023-03-01 DIAGNOSIS — N760 Acute vaginitis: Secondary | ICD-10-CM

## 2023-03-01 DIAGNOSIS — B379 Candidiasis, unspecified: Secondary | ICD-10-CM | POA: Diagnosis not present

## 2023-03-01 LAB — URINALYSIS, W/ REFLEX TO CULTURE (INFECTION SUSPECTED)
Bilirubin Urine: NEGATIVE
Glucose, UA: NEGATIVE mg/dL
Ketones, ur: 15 mg/dL — AB
Leukocytes,Ua: NEGATIVE
Nitrite: NEGATIVE
Protein, ur: NEGATIVE mg/dL
Specific Gravity, Urine: 1.025 (ref 1.005–1.030)
pH: 5.5 (ref 5.0–8.0)

## 2023-03-01 LAB — WET PREP, GENITAL
Sperm: NONE SEEN
Trich, Wet Prep: NONE SEEN
WBC, Wet Prep HPF POC: >10 — AB (ref <10)

## 2023-03-01 MED ORDER — FLUCONAZOLE 150 MG PO TABS: ORAL_TABLET | ORAL | 0 refills | Status: DC

## 2023-03-01 MED ORDER — METRONIDAZOLE 500 MG PO TABS: 500.0000 mg | ORAL_TABLET | Freq: Two times a day (BID) | ORAL | 0 refills | Status: AC

## 2023-03-01 NOTE — ED Provider Notes (Signed)
[facility]    CSN: [id number] Arrival date & time: 03/01/23  1853      History   Chief Complaint Chief Complaint  Patient presents with   Urinary Frequency    HPI Brittany Rivers is a 47 y.o. female.   47 year old female, Brittany Rivers, presents to urgent care with chief complaint of urinary frequency, mild itching.  Patient denies any recent new sexual partner, states she is going out of town and just wants to get checked for UTI/yeast infection.  The history is provided by the patient. No language interpreter was used.    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      Home Medications    Prior to Admission medications   Medication Sig Start Date End Date Taking? Authorizing Provider  fluconazole (DIFLUCAN) 150 MG tablet Take 1 dose today, repeat in 3 days, repeat on day 7 03/01/23  Yes Cathren Sween, Para March, NP  Fluocinolone Acetonide 0.01 % OIL SMARTSIG:3-4 Drop(s) In Ear(s) 3 Times Daily PRN 07/12/21  Yes [provider]  ibuprofen (ADVIL) 200 MG tablet Take 200 mg by mouth every 6 (six) hours as needed.   Yes [provider]  LORazepam (ATIVAN) 0.5 MG tablet TAKE 1/2  1 TABLET BY MOUTH AT BEDTIME AS NEEDED 10/07/19  Yes [provider]  metroNIDAZOLE (FLAGYL) 500 MG tablet Take 1 tablet (500 mg total) by mouth 2 (two) times daily for  7 days. 03/01/23 03/08/23 Yes Dallas Torok, Para March, NP  acetaminophen (TYLENOL) 325 MG tablet Take 650 mg by mouth every 6 (six) hours as needed.    [provider]  Fluocinolone Acetonide 0.01 % OIL PUT 3-4 DROPS IN EACH EAR THREE TIMES DAILY AS NEEDED FOR ITCHING FOR UP TO TWO WEEKS. 07/12/21   [provider]  lidocaine (XYLOCAINE) 2 % solution Use as directed 15 mLs in the mouth or throat every 6 (six) hours as needed for mouth pain. 01/01/22   Raspet, Noberto Retort, PA-C    Family History Family History  Problem Relation Age of Onset   Breast cancer Neg Hx     Social History Social History   Tobacco Use   Smoking status: Never   Smokeless tobacco: Never  Vaping Use   Vaping Use: Never used  Substance Use Topics   Alcohol use: No   Drug use: Never     Allergies   Penicillins   Review of Systems Review of Systems   Physical Exam Triage Vital Signs ED Triage Vitals  Enc Vitals Group     BP 03/01/23 1913 118/81     Pulse Rate 03/01/23 1913 76     Resp --      Temp 03/01/23 1913 98.4 F (36.9 C)     Temp Source 03/01/23 1913 Oral  SpO2 03/01/23 1913 98 %     Weight 03/01/23 1912 169 lb (76.7 kg)     Height 03/01/23 1912 5\' 7"  (1.702 m)     Head Circumference --      Peak Flow --      Pain Score 03/01/23 1912 0     Pain Loc --      Pain Edu? --      Excl. in GC? --    No data found.  Updated Vital Signs BP 118/81 (BP Location: Left Arm)   Pulse 76   Temp 98.4 F (36.9 C) (Oral)   Ht 5\' 7"  (1.702 m)   Wt 169 lb (76.7 kg)   LMP 03/01/2023   SpO2 98%   BMI 26.47 kg/m   Visual Acuity Right Eye Distance:   Left Eye Distance:   Bilateral Distance:    Right Eye Near:   Left Eye Near:    Bilateral Near:     Physical Exam   UC Treatments / Results  Labs (all labs ordered are listed, but only abnormal results are displayed) Labs Reviewed  WET PREP, GENITAL - Abnormal; Notable for the following components:      Result Value   Yeast  Wet Prep HPF POC PRESENT (*)    Clue Cells Wet Prep HPF POC PRESENT (*)    WBC, Wet Prep HPF POC >10 (*)    All other components within normal limits  URINALYSIS, W/ REFLEX TO CULTURE (INFECTION SUSPECTED) - Abnormal; Notable for the following components:   APPearance HAZY (*)    Hgb urine dipstick MODERATE (*)    Ketones, ur 15 (*)    Bacteria, UA FEW (*)    All other components within normal limits    EKG   Radiology No results found.  Procedures Procedures (including critical care time)  Medications Ordered in UC Medications - No data to display  Initial Impression / Assessment and Plan / UC Course  I have reviewed the triage vital signs and the nursing notes.  Pertinent labs & imaging results that were available during my care of the patient were reviewed by me and considered in my medical decision making (see chart for details).     *** Final Clinical Impressions(s) / UC Diagnoses   Final diagnoses:  BV (bacterial vaginosis)  Yeast infection     Discharge Instructions      Drink plenty of fluids as you are mildly dehydrated today in the office, you do not have a UTI.  You have a bacterial vaginitis as well as a yeast infection.  Take medication as directed.  Follow-up with PCP.  Return as needed     ED Prescriptions     Medication Sig Dispense Auth. Provider   fluconazole (DIFLUCAN) 150 MG tablet Take 1 dose today, repeat in 3 days, repeat on day 7 3 tablet Laquinta Hazell, Para March, NP   metroNIDAZOLE (FLAGYL) 500 MG tablet Take 1 tablet (500 mg total) by mouth 2 (two) times daily for 7 days. 14 tablet Catelynn Sparger, Para March, NP      PDMP not reviewed this encounter.

## 2023-03-01 NOTE — ED Triage Notes (Signed)
Pt c/o urinary frequency and urgency, vaginal itching, vaginal irritation x3days

## 2023-03-01 NOTE — Discharge Instructions (Addendum)
Drink plenty of fluids as you are mildly dehydrated today in the office, you do not have a UTI.  You have a bacterial vaginitis as well as a yeast infection.  Take medication as directed.  Follow-up with PCP.  Return as needed

## 2023-03-02 ENCOUNTER — Telehealth: Payer: Self-pay

## 2023-03-02 NOTE — Telephone Encounter (Signed)
Received message pt had questions regarding her diagnosis and treatment on 03/01/23. Attempt to return call but no answer and could not leave a message

## 2023-08-24 ENCOUNTER — Encounter: Payer: Self-pay | Admitting: Emergency Medicine

## 2023-08-24 ENCOUNTER — Ambulatory Visit
Admission: EM | Admit: 2023-08-24 | Discharge: 2023-08-24 | Disposition: A | Discharge status: Home / Self Care | Payer: Managed Care, Other (non HMO) | Attending: Family Medicine | Admitting: Family Medicine

## 2023-08-24 DIAGNOSIS — N3000 Acute cystitis without hematuria: Secondary | ICD-10-CM | POA: Insufficient documentation

## 2023-08-24 LAB — WET PREP, GENITAL
Clue Cells Wet Prep HPF POC: NONE SEEN
Sperm: NONE SEEN
Trich, Wet Prep: NONE SEEN
WBC, Wet Prep HPF POC: <10 (ref <10)

## 2023-08-24 LAB — URINALYSIS, W/ REFLEX TO CULTURE (INFECTION SUSPECTED)
Bilirubin Urine: NEGATIVE
Glucose, UA: NEGATIVE mg/dL
Hgb urine dipstick: NEGATIVE
Ketones, ur: NEGATIVE mg/dL
Nitrite: NEGATIVE
Protein, ur: NEGATIVE mg/dL
Specific Gravity, Urine: 1.015 (ref 1.005–1.030)
pH: 7 (ref 5.0–8.0)

## 2023-08-24 MED ORDER — NITROFURANTOIN MONOHYD MACRO 100 MG PO CAPS: 100.0000 mg | ORAL_CAPSULE | Freq: Two times a day (BID) | ORAL | 0 refills | Status: AC

## 2023-08-24 MED ORDER — FLUCONAZOLE 150 MG PO TABS: 150.0000 mg | ORAL_TABLET | Freq: Once | ORAL | 0 refills | Status: AC

## 2023-08-24 NOTE — ED Provider Notes (Signed)
[facility]    CSN: [id number] Arrival date & time: 08/24/23  1129      History   Chief Complaint Chief Complaint  Patient presents with   Urinary Frequency    HPI Brittany Rivers is a 47 y.o. female who presents with urinary frequency.  3-day history of urinary symptoms.  She reports urinary frequency and urgency.  No abdominal pain.  No flank pain.  No fever.  She has had some vaginal irritation.  No discharge.  No relieving factors.  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      Home Medications    Prior to Admission medications   Medication Sig Start Date End Date Taking? Authorizing Provider  fluconazole (DIFLUCAN) 150 MG tablet Take 1 tablet (150 mg total) by mouth once for 1 dose. Repeat dose in 72 hours. 08/24/23 08/24/23 Yes Sarayu Prevost G, DO  nitrofurantoin, macrocrystal-monohydrate, (MACROBID) 100 MG capsule Take 1 capsule (100 mg total) by mouth 2 (two) times daily. 08/24/23  Yes Dilraj Killgore G, DO  acetaminophen (TYLENOL) 325 MG tablet Take 650 mg by mouth every 6 (six) hours as needed.    [provider]  Fluocinolone Acetonide 0.01 % OIL PUT 3-4 DROPS IN EACH EAR THREE TIMES DAILY AS NEEDED FOR ITCHING FOR UP TO TWO WEEKS. 07/12/21   [provider]  Fluocinolone Acetonide 0.01 % OIL SMARTSIG:3-4 Drop(s) In Ear(s) 3 Times Daily PRN  07/12/21   [provider]  ibuprofen (ADVIL) 200 MG tablet Take 200 mg by mouth every 6 (six) hours as needed.    [provider]  lidocaine (XYLOCAINE) 2 % solution Use as directed 15 mLs in the mouth or throat every 6 (six) hours as needed for mouth pain. 01/01/22   Raspet, Noberto Retort, PA-C  LORazepam (ATIVAN) 0.5 MG tablet TAKE 1/2  1 TABLET BY MOUTH AT BEDTIME AS NEEDED 10/07/19   [provider]    Family History Family History  Problem Relation Age of Onset   Breast cancer Neg Hx     Social History Social History   Tobacco Use   Smoking status: Never   Smokeless tobacco: Never  Vaping Use   Vaping status: Never Used  Substance Use Topics   Alcohol use: No   Drug use: Never     Allergies   Penicillins   Review of Systems Review of Systems  Genitourinary:  Positive for frequency.     Physical Exam Triage Vital Signs ED Triage Vitals  Encounter Vitals Group     BP 08/24/23 1143 134/81     Systolic BP Percentile --      Diastolic BP Percentile --      Pulse Rate 08/24/23 1143 80     Resp 08/24/23 1143 14  Temp 08/24/23 1143 98.4 F (36.9 C)     Temp Source 08/24/23 1143 Oral     SpO2 08/24/23 1143 98 %     Weight 08/24/23 1142 169 lb 1.5 oz (76.7 kg)     Height 08/24/23 1142 5\' 7"  (1.702 m)     Head Circumference --      Peak Flow --      Pain Score 08/24/23 1142 0     Pain Loc --      Pain Education --      Exclude from Growth Chart --    No data found.  Updated Vital Signs BP 134/81 (BP Location: Left Arm)   Pulse 80   Temp 98.4 F (36.9 C) (Oral)   Resp 14   Ht 5\' 7"  (1.702 m)   Wt 76.7 kg   LMP 08/10/2023 (Approximate)   SpO2 98%   BMI 26.48 kg/m   Visual Acuity Right Eye Distance:   Left Eye Distance:   Bilateral Distance:    Right Eye Near:   Left Eye Near:    Bilateral Near:     Physical Exam Vitals and nursing note reviewed.  Constitutional:      General: She is not in acute distress.     Appearance: Normal appearance.  HENT:     Head: Normocephalic and atraumatic.  Eyes:     General:        Right eye: No discharge.        Left eye: No discharge.     Conjunctiva/sclera: Conjunctivae normal.  Cardiovascular:     Rate and Rhythm: Normal rate and regular rhythm.  Pulmonary:     Effort: Pulmonary effort is normal.     Breath sounds: Normal breath sounds. No wheezing, rhonchi or rales.  Abdominal:     General: There is no distension.     Palpations: Abdomen is soft.     Tenderness: There is no abdominal tenderness.  Neurological:     Mental Status: She is alert.  Psychiatric:        Mood and Affect: Mood normal.        Behavior: Behavior normal.      UC Treatments / Results  Labs (all labs ordered are listed, but only abnormal results are displayed) Labs Reviewed  WET PREP, GENITAL - Abnormal; Notable for the following components:      Result Value   Yeast Wet Prep HPF POC PRESENT (*)    All other components within normal limits  URINALYSIS, W/ REFLEX TO CULTURE (INFECTION SUSPECTED) - Abnormal; Notable for the following components:   Leukocytes,Ua SMALL (*)    Bacteria, UA RARE (*)    All other components within normal limits    EKG   Radiology No results found.  Procedures Procedures (including critical care time)  Medications Ordered in UC Medications - No data to display  Initial Impression / Assessment and Plan / UC Course  I have reviewed the triage vital signs and the nursing notes.  Pertinent labs & imaging results that were available during my care of the patient were reviewed by me and considered in my medical decision making (see chart for details).    47 year old female presents with a UTI.  Treating with nitrofurantoin.  Yeast was noted as well.  Diflucan as directed.  Final Clinical Impressions(s) / UC Diagnoses   Final diagnoses:  Acute cystitis without hematuria   Discharge Instructions   None    ED Prescriptions  Medication Sig Dispense Auth. Provider   fluconazole (DIFLUCAN) 150 MG tablet Take 1 tablet (150 mg total) by mouth once for 1 dose. Repeat dose in 72 hours. 2 tablet Kristan Brummitt G, DO   nitrofurantoin, macrocrystal-monohydrate, (MACROBID) 100 MG capsule Take 1 capsule (100 mg total) by mouth 2 (two) times daily. 14 capsule Everlene Other G, DO      PDMP not reviewed this encounter.   Tommie Sams, [place] 08/24/23 1235

## 2023-08-24 NOTE — ED Triage Notes (Signed)
Patient reports urinary frequency and irritation that started 3 days ago.

## 2023-08-25 ENCOUNTER — Ambulatory Visit: Payer: Managed Care, Other (non HMO)

## 2023-09-06 IMAGING — MG MM DIGITAL SCREENING BILAT W/ TOMO AND CAD
8 series · 8 of 24 positions shown · non-contrast
Comparison: Previous exam(s).

CLINICAL DATA: Screening.

EXAM:
DIGITAL SCREENING BILATERAL MAMMOGRAM WITH TOMOSYNTHESIS AND CAD
TECHNIQUE: Bilateral screening digital craniocaudal and mediolateral oblique
mammograms were obtained. Bilateral screening digital breast
tomosynthesis was performed. The images were evaluated with
computer-aided detection.

[L CC synth-2D]
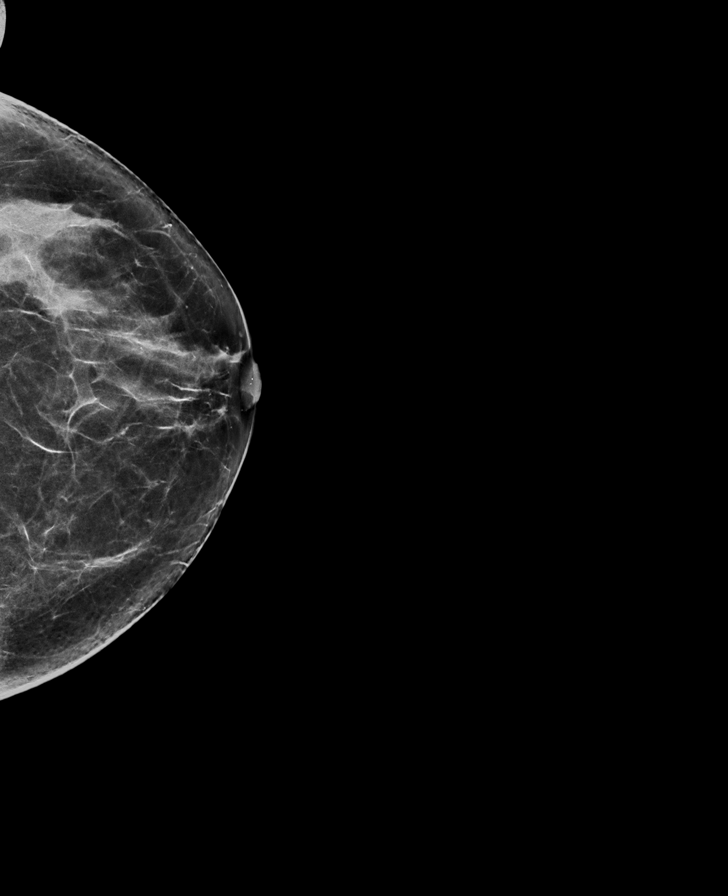

[R MLO synth-2D]
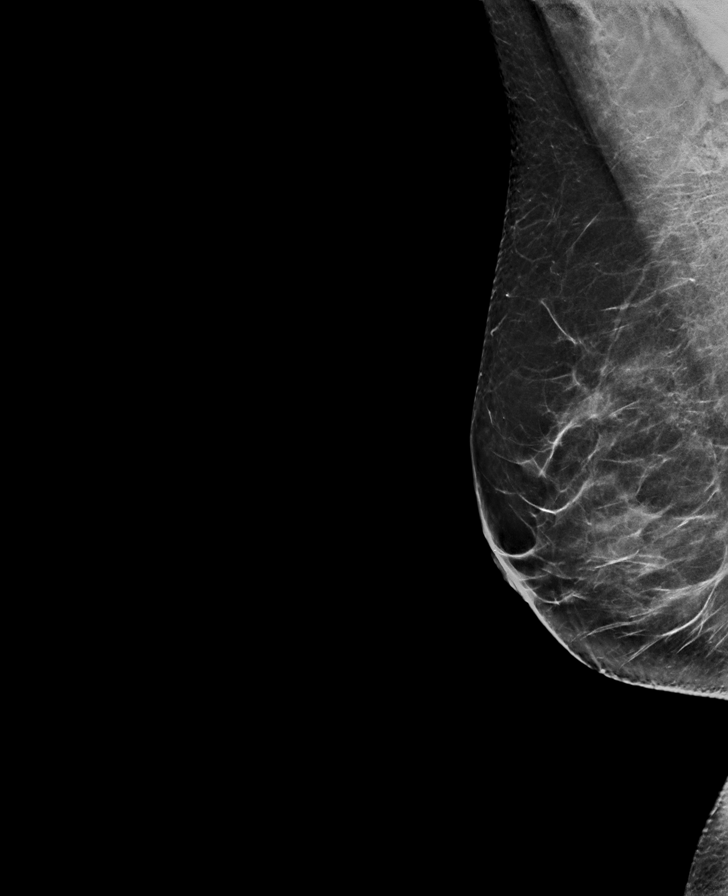

[R CC synth-2D]
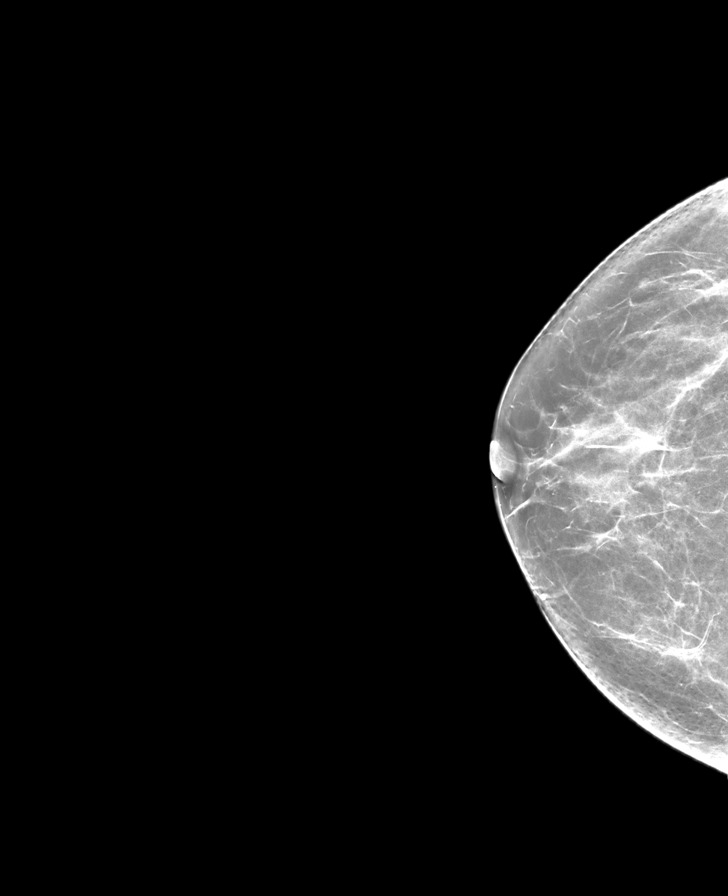

[L MLO synth-2D]
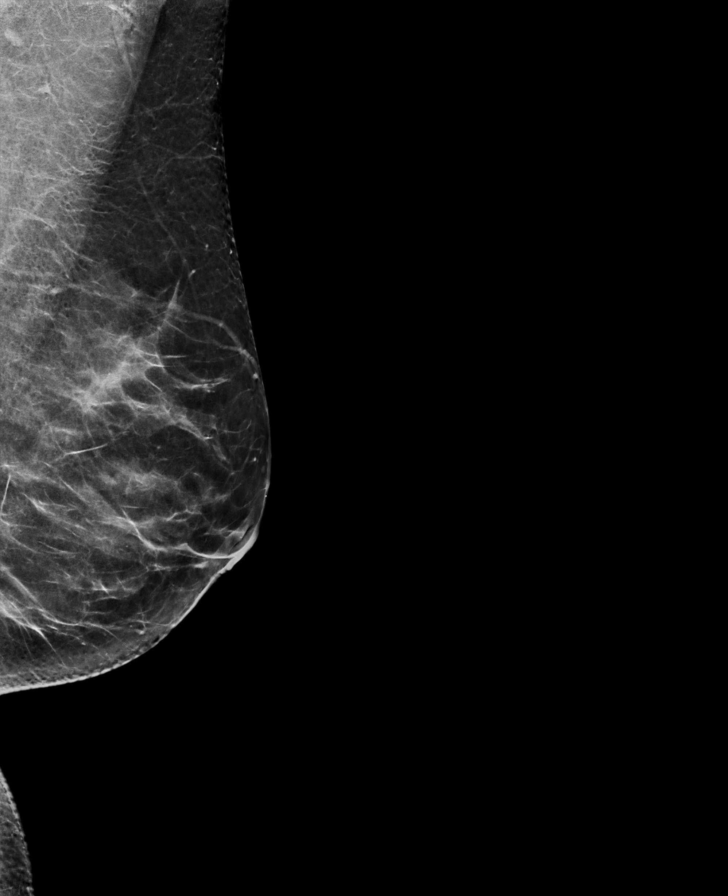

[R CC tomo · tomo slice 35/68.0]
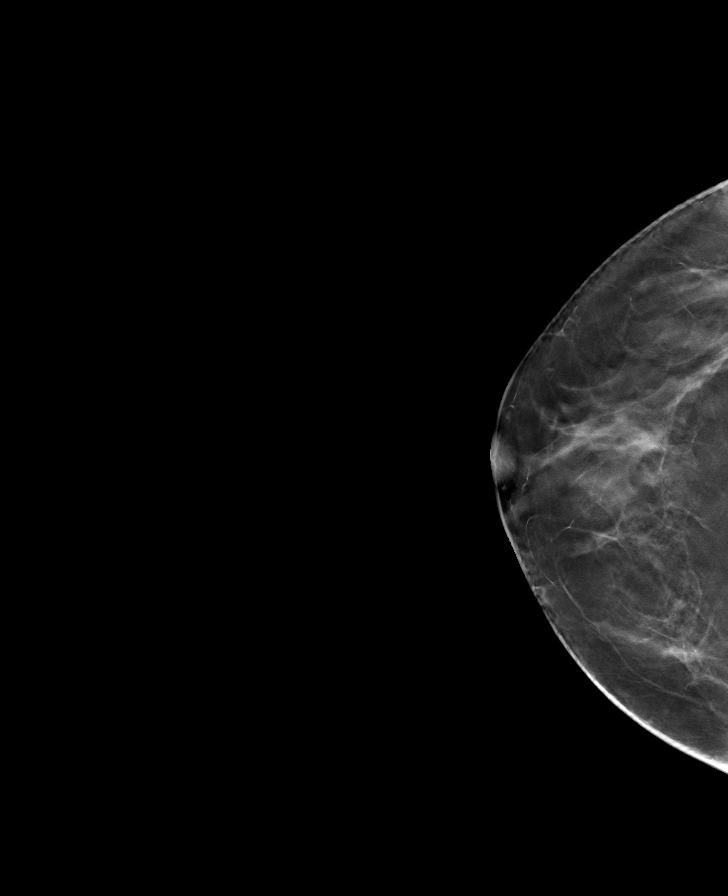

[L MLO tomo · tomo slice 37/72.0]
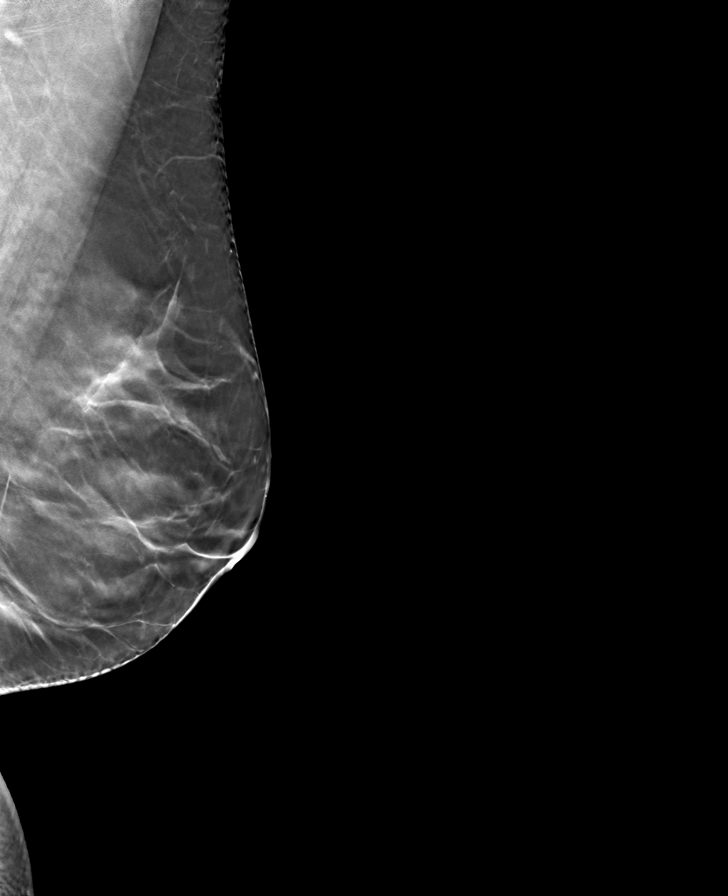

[R MLO tomo · tomo slice 37/73.0]
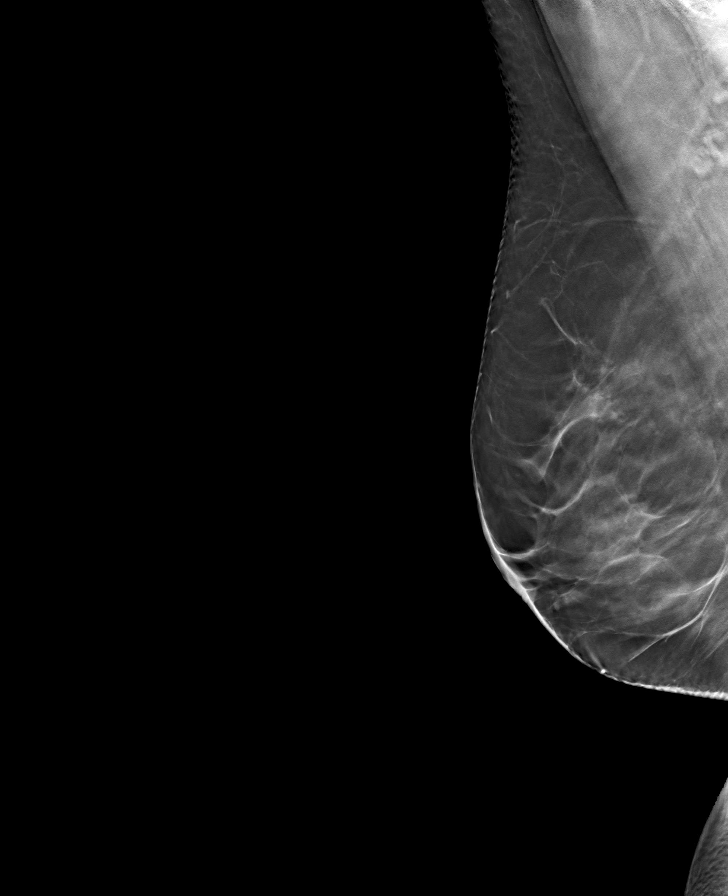

[L CC tomo · tomo slice 35/68.0]
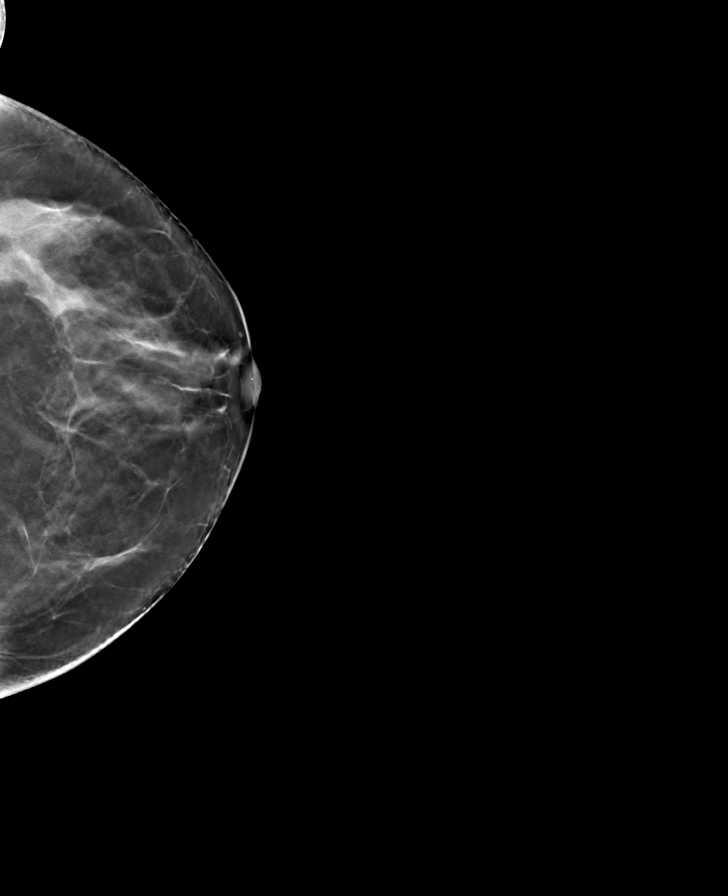

[8 of 24 positions shown; findings below may reference images not displayed]

ACR Breast Density Category c: The breast tissue is heterogeneously
dense, which may obscure small masses.
FINDINGS: There are no findings suspicious for malignancy.
IMPRESSION: No mammographic evidence of malignancy. A result letter of this
screening mammogram will be mailed directly to the patient.

RECOMMENDATION:
Screening mammogram in one year. (Code:Q3-W-BC3)

BI-RADS CATEGORY  1: Negative.

## 2023-10-03 ENCOUNTER — Encounter: Payer: Managed Care, Other (non HMO) | Admitting: Dermatology

## 2023-10-03 ENCOUNTER — Other Ambulatory Visit: Payer: Self-pay | Admitting: Physician Assistant

## 2023-10-03 DIAGNOSIS — Z1231 Encounter for screening mammogram for malignant neoplasm of breast: Secondary | ICD-10-CM

## 2023-11-27 ENCOUNTER — Ambulatory Visit
Admission: RE | Admit: 2023-11-27 | Discharge: 2023-11-27 | Disposition: A | Discharge status: Home / Self Care | Payer: Managed Care, Other (non HMO) | Source: Ambulatory Visit | Attending: Physician Assistant | Admitting: Physician Assistant

## 2023-11-27 DIAGNOSIS — Z1231 Encounter for screening mammogram for malignant neoplasm of breast: Secondary | ICD-10-CM | POA: Insufficient documentation

## 2024-03-24 ENCOUNTER — Ambulatory Visit: Payer: Managed Care, Other (non HMO) | Admitting: Dermatology

## 2024-05-05 ENCOUNTER — Ambulatory Visit

## 2024-05-05 DIAGNOSIS — L2489 Irritant contact dermatitis due to other agents: Secondary | ICD-10-CM

## 2024-05-05 DIAGNOSIS — L578 Other skin changes due to chronic exposure to nonionizing radiation: Secondary | ICD-10-CM

## 2024-05-05 DIAGNOSIS — Z1283 Encounter for screening for malignant neoplasm of skin: Secondary | ICD-10-CM | POA: Diagnosis not present

## 2024-05-05 DIAGNOSIS — L821 Other seborrheic keratosis: Secondary | ICD-10-CM | POA: Diagnosis not present

## 2024-05-05 DIAGNOSIS — W908XXA Exposure to other nonionizing radiation, initial encounter: Secondary | ICD-10-CM

## 2024-05-05 DIAGNOSIS — L820 Inflamed seborrheic keratosis: Secondary | ICD-10-CM | POA: Diagnosis not present

## 2024-05-05 DIAGNOSIS — D1801 Hemangioma of skin and subcutaneous tissue: Secondary | ICD-10-CM

## 2024-05-05 DIAGNOSIS — Z86018 Personal history of other benign neoplasm: Secondary | ICD-10-CM

## 2024-05-05 MED ORDER — TRIAMCINOLONE ACETONIDE 0.1 % EX OINT: 1.0000 | TOPICAL_OINTMENT | Freq: Two times a day (BID) | CUTANEOUS | 0 refills | Status: AC | PRN

## 2024-05-05 NOTE — Addendum Note (Signed)
 Addended by: RAYMUND LAURAINE BROCKS on: 05/05/2024 03:12 PM   Modules accepted: Level of Service

## 2024-05-05 NOTE — Progress Notes (Signed)
 Follow-Up Visit   Subjective  Brittany Rivers is a 48 y.o. female who presents for the following: Skin Cancer Screening and Full Body Skin Exam, hx of Dysplastic nevus  The patient presents for Total-Body Skin Exam (TBSE) for skin cancer screening and mole check. The patient has spots, moles and lesions to be evaluated, some may be new or changing and the patient may have concern these could be cancer.    The following portions of the chart were reviewed this encounter and updated as appropriate: medications, allergies, medical history  Review of Systems:  No other skin or systemic complaints except as noted in HPI or Assessment and Plan.  Objective  Well appearing patient in no apparent distress; mood and affect are within normal limits.  A full examination was performed including scalp, head, eyes, ears, nose, lips, neck, chest, axillae, abdomen, back, buttocks, bilateral upper extremities, bilateral lower extremities, hands, feet, fingers, toes, fingernails, and toenails. All findings within normal limits unless otherwise noted below.   Relevant physical exam findings are noted in the Assessment and Plan.  Left flank x 1 Stuck-on, waxy, tan-brown papules and plaques -- Discussed benign etiology and prognosis.   Assessment & Plan   SKIN CANCER SCREENING PERFORMED TODAY.  ACTINIC DAMAGE - Chronic condition, secondary to cumulative UV/sun exposure - diffuse scaly erythematous macules with underlying dyspigmentation - Recommend daily broad spectrum sunscreen SPF 30+ to sun-exposed areas, reapply every 2 hours as needed.  - Staying in the shade or wearing long sleeves, sun glasses (UVA+UVB protection) and wide brim hats (4-inch brim around the entire circumference of the hat) are also recommended for sun protection.  - Call for new or changing lesions.  LENTIGINES, SEBORRHEIC KERATOSES, HEMANGIOMAS - Benign normal skin lesions - Benign-appearing - Call for any  changes  MELANOCYTIC NEVI - Tan-brown and/or pink-flesh-colored symmetric macules and papules - Benign appearing on exam today - Observation - Call clinic for new or changing moles - Recommend daily use of broad spectrum spf 30+ sunscreen to sun-exposed areas.   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 Your physician has selected the right strength medicine for your problem and area affected on the body. Please use your medication only as directed by your physician to prevent side effects.      HISTORY OF DYSPLASTIC NEVUS No evidence of recurrence today Recommend regular full body skin exams Recommend daily broad spectrum sunscreen SPF 30+ to sun-exposed areas, reapply every 2 hours as needed.  Call if any new or changing lesions are noted between office visits   INFLAMED SEBORRHEIC KERATOSIS Left flank x 1 Symptomatic, irritating, patient would like treated.  Destruction of lesion - Left flank x 1 Complexity: simple   Destruction method: cryotherapy   Informed consent: discussed and consent obtained   Timeout:  patient name, date of birth, surgical site, and procedure verified Lesion destroyed using liquid nitrogen: Yes   Region frozen until ice ball extended beyond lesion: Yes   Outcome: patient tolerated procedure well with no complications   Post-procedure details: wound care instructions given     Return in about 1 year (around 05/05/2025) for TBSE, hx of Dysplastic nevus .  I,  Fay Kirks, CMA, am acting as scribe for Lauraine JAYSON Kanaris, MD .   Documentation: I have reviewed the above documentation for accuracy and completeness,  and I agree with the above.  Lauraine JAYSON Kanaris, MD

## 2024-05-05 NOTE — Patient Instructions (Addendum)
 Seborrheic Keratosis  What causes seborrheic keratoses? Seborrheic keratoses are harmless, common skin growths that first appear during adult life.  As time goes by, more growths appear.  Some people may develop a large number of them.  Seborrheic keratoses appear on both covered and uncovered body parts.  They are not caused by sunlight.  The tendency to develop seborrheic keratoses can be inherited.  They vary in color from skin-colored to gray, brown, or even black.  They can be either smooth or have a rough, warty surface.   Seborrheic keratoses are superficial and look as if they were stuck on the skin.  Under the microscope this type of keratosis looks like layers upon layers of skin.  That is why at times the top layer may seem to fall off, but the rest of the growth remains and re-grows.    Treatment Seborrheic keratoses do not need to be treated, but can easily be removed in the office.  Seborrheic keratoses often cause symptoms when they rub on clothing or jewelry.  Lesions can be in the way of shaving.  If they become inflamed, they can cause itching, soreness, or burning.  Removal of a seborrheic keratosis can be accomplished by freezing, burning, or surgery. If any spot bleeds, scabs, or grows rapidly, please return to have it checked, as these can be an indication of a skin cancer.     Melanoma ABCDEs  Melanoma is the most dangerous type of skin cancer, and is the leading cause of death from skin disease.  You are more likely to develop melanoma if you: Have light-colored skin, light-colored eyes, or red or blond hair Spend a lot of time in the sun Tan regularly, either outdoors or in a tanning bed Have had blistering sunburns, especially during childhood Have a close family member who has had a melanoma Have atypical moles or large birthmarks  Early detection of melanoma is key since treatment is typically straightforward and cure rates are extremely high if we catch it early.    The first sign of melanoma is often a change in a mole or a new dark spot.  The ABCDE system is a way of remembering the signs of melanoma.  A for asymmetry:  The two halves do not match. B for border:  The edges of the growth are irregular. C for color:  A mixture of colors are present instead of an even brown color. D for diameter:  Melanomas are usually (but not always) greater than 6mm - the size of a pencil eraser. E for evolution:  The spot keeps changing in size, shape, and color.  Please check your skin once per month between visits. You can use a small mirror in front and a large mirror behind you to keep an eye on the back side or your body.   If you see any new or changing lesions before your next follow-up, please call to schedule a visit.  Please continue daily skin protection including broad spectrum sunscreen SPF 30+ to sun-exposed areas, reapplying every 2 hours as needed when you're outdoors.        Cryotherapy Aftercare  Wash gently with soap and water everyday.   Apply Vaseline and Band-Aid daily until healed.      Due to recent changes in healthcare laws, you may see results of your pathology and/or laboratory studies on MyChart before the doctors have had a chance to review them. We understand that in some cases there may be results that  are confusing or concerning to you. Please understand that not all results are received at the same time and often the doctors may need to interpret multiple results in order to provide you with the best plan of care or course of treatment. Therefore, we ask that you please give us  2 business days to thoroughly review all your results before contacting the office for clarification. Should we see a critical lab result, you will be contacted sooner.   If You Need Anything After Your Visit  If you have any questions or concerns for your doctor, please call our main line at [phone] and press option 4 to reach your doctor's  medical assistant. If no one answers, please leave a voicemail as directed and we will return your call as soon as possible. Messages left after 4 pm will be answered the following business day.   You may also send us  a message via MyChart. We typically respond to MyChart messages within 1-2 business days.  For prescription refills, please ask your pharmacy to contact our office. Our fax number is [phone].  If you have an urgent issue when the clinic is closed that cannot wait until the next business day, you can page your doctor at the number below.    Please note that while we do our best to be available for urgent issues outside of office hours, we are not available 24/7.   If you have an urgent issue and are unable to reach us , you may choose to seek medical care at your doctor's office, retail clinic, urgent care center, or emergency room.  If you have a medical emergency, please immediately call 911 or go to the emergency department.  Pager Numbers  - Dr. Hester: [phone]  - Dr. Jackquline: [phone]  - Dr. Claudene: [phone]   - Dr. Raymund: [phone]  In the event of inclement weather, please call our main line at [phone] for an update on the status of any delays or closures.  Dermatology Medication Tips: Please keep the boxes that topical medications come in in order to help keep track of the instructions about where and how to use these. Pharmacies typically print the medication instructions only on the boxes and not directly on the medication tubes.   If your medication is too expensive, please contact our office at [phone] option 4 or send us  a message through MyChart.   We are unable to tell what your co-pay for medications will be in advance as this is different depending on your insurance coverage. However, we may be able to find a substitute medication at lower cost or fill out paperwork to get insurance to cover a needed medication.   If a  prior authorization is required to get your medication covered by your insurance company, please allow us  1-2 business days to complete this process.  Drug prices often vary depending on where the prescription is filled and some pharmacies may offer cheaper prices.  The website www.goodrx.com contains coupons for medications through different pharmacies. The prices here do not account for what the cost may be with help from insurance (it may be cheaper with your insurance), but the website can give you the price if you did not use any insurance.  - You can print the associated coupon and take it with your prescription to the pharmacy.  - You may also stop by our office during regular business hours and pick up a GoodRx coupon card.  - If you need your prescription sent  electronically to a different pharmacy, notify our office through [facility] or by phone at [phone] option 4.     Si Usted Necesita Algo Despus de Su Visita  Tambin puede enviarnos un mensaje a travs de Clinical cytogeneticist. Por lo general respondemos a los mensajes de MyChart en el transcurso de 1 a 2 das hbiles.  Para renovar recetas, por favor pida a su farmacia que se ponga en contacto con nuestra oficina. Randi lakes de fax es [place] [phone].  Si tiene un asunto urgente cuando la clnica est cerrada y que no puede esperar hasta el siguiente da hbil, puede llamar/localizar a su doctor(a) al nmero que aparece a continuacin.   Por favor, tenga en cuenta que aunque hacemos todo lo posible para estar disponibles para asuntos urgentes fuera del horario de [place], no estamos disponibles las 24 horas del da, los 7 [address]  [address] de la [place].   Si tiene un problema urgente y no puede comunicarse con nosotros, puede optar por buscar atencin mdica  en el consultorio de su doctor(a), en una clnica privada, en un centro de atencin urgente o en una sala de emergencias.  Si tiene Engineer, drilling, por favor llame  inmediatamente al 911 o vaya a la sala de emergencias.  Nmeros de bper  - Dr. Hester: [phone]  - Dra. Jackquline: [phone]  - Dr. Claudene: [phone]  - Dra. Kitts: [phone]  En caso de inclemencias del [place], por favor llame a nuestra lnea principal al [phone] para una actualizacin sobre el estado de cualquier retraso o cierre.  Consejos para la medicacin en dermatologa: Por favor, guarde las cajas en las que vienen los medicamentos de uso tpico para ayudarle a seguir las instrucciones sobre dnde y cmo usarlos. Las farmacias generalmente imprimen las instrucciones del medicamento slo en las cajas y no directamente en los tubos del [place].   Si su medicamento es muy caro, por favor, pngase en contacto con landry rieger llamando al [phone] y presione la opcin 4 o envenos un mensaje a travs de Clinical cytogeneticist.   No podemos decirle cul ser su copago por los medicamentos por adelantado ya que esto es diferente dependiendo de la cobertura de su seguro. Sin embargo, es posible que podamos encontrar un medicamento sustituto a Audiological scientist un formulario para que el seguro cubra el medicamento que se considera necesario.   Si se requiere una autorizacin previa para que su compaa de seguros malta su medicamento, por favor permtanos de 1 a 2 das hbiles para completar este proceso.  Los precios de los medicamentos varan con frecuencia dependiendo del Environmental consultant de dnde se surte la receta y alguna farmacias pueden ofrecer precios ms baratos.  El sitio web www.goodrx.com tiene cupones para medicamentos de Health and safety inspector. Los precios aqu no tienen en cuenta lo que podra costar con la ayuda del seguro (puede ser ms barato con su seguro), pero el sitio web puede darle el precio si no utiliz Tourist information centre manager.  - Puede imprimir el cupn correspondiente y llevarlo con su receta a la farmacia.  - Tambin puede pasar por nuestra oficina durante el horario  de atencin regular y Education officer, museum una tarjeta de cupones de GoodRx.  - Si necesita que su receta se enve electrnicamente a una farmacia diferente, informe a nuestra oficina a travs de MyChart de [place] o por telfono llamando al [phone] y presione la opcin 4.

## 2024-06-10 ENCOUNTER — Other Ambulatory Visit: Payer: Self-pay | Admitting: Physician Assistant

## 2024-06-10 DIAGNOSIS — Z1231 Encounter for screening mammogram for malignant neoplasm of breast: Secondary | ICD-10-CM

## 2024-10-07 ENCOUNTER — Ambulatory Visit: Payer: Self-pay

## 2024-10-07 DIAGNOSIS — K640 First degree hemorrhoids: Secondary | ICD-10-CM | POA: Diagnosis not present

## 2024-10-07 DIAGNOSIS — Z1211 Encounter for screening for malignant neoplasm of colon: Secondary | ICD-10-CM | POA: Diagnosis present

## 2024-10-12 ENCOUNTER — Ambulatory Visit: Admitting: Dermatology

## 2024-10-19 ENCOUNTER — Encounter: Admitting: Dermatology

## 2024-11-30 ENCOUNTER — Ambulatory Visit

## 2025-05-05 ENCOUNTER — Encounter
